# Patient Record
Sex: Male | Born: 1970 | Race: White | Hispanic: No | Marital: Married | Smoking: Former smoker
Health system: Southern US, Community
[De-identification: ages and names within clinical notes are randomized; demographics above are authoritative.]

## PROBLEM LIST (undated history)

## (undated) DIAGNOSIS — R079 Chest pain, unspecified: Secondary | ICD-10-CM

## (undated) DIAGNOSIS — I10 Essential (primary) hypertension: Secondary | ICD-10-CM

## (undated) DIAGNOSIS — R002 Palpitations: Secondary | ICD-10-CM

## (undated) HISTORY — DX: Essential (primary) hypertension: I10

## (undated) HISTORY — PX: ADENOIDECTOMY: SUR15

## (undated) HISTORY — DX: Chest pain, unspecified: R07.9

## (undated) HISTORY — DX: Palpitations: R00.2

---

## 2003-01-19 ENCOUNTER — Encounter: Admission: RE | Admit: 2003-01-19 | Discharge: 2003-01-19 | Payer: Self-pay | Admitting: Family Medicine

## 2003-01-19 ENCOUNTER — Encounter: Payer: Self-pay | Admitting: Family Medicine

## 2004-10-01 ENCOUNTER — Ambulatory Visit: Payer: Self-pay | Admitting: Internal Medicine

## 2004-12-16 ENCOUNTER — Ambulatory Visit: Payer: Self-pay | Admitting: Internal Medicine

## 2005-09-30 ENCOUNTER — Ambulatory Visit: Payer: Self-pay | Admitting: Internal Medicine

## 2005-10-01 ENCOUNTER — Encounter: Admission: RE | Admit: 2005-10-01 | Discharge: 2005-11-03 | Payer: Self-pay | Admitting: Internal Medicine

## 2006-03-31 ENCOUNTER — Ambulatory Visit: Payer: Self-pay | Admitting: Internal Medicine

## 2008-03-05 ENCOUNTER — Ambulatory Visit: Payer: Self-pay | Admitting: Internal Medicine

## 2008-03-08 ENCOUNTER — Encounter (INDEPENDENT_AMBULATORY_CARE_PROVIDER_SITE_OTHER): Payer: Self-pay | Admitting: *Deleted

## 2008-03-08 LAB — CONVERTED CEMR LAB
AST: 11 units/L (ref 0–37)
Basophils Absolute: 0 10*3/uL (ref 0.0–0.1)
Chloride: 106 meq/L (ref 96–112)
Eosinophils Absolute: 0.3 10*3/uL (ref 0.0–0.7)
Eosinophils Relative: 5 % (ref 0.0–5.0)
GFR calc non Af Amer: 100 mL/min
HDL: 30.1 mg/dL — ABNORMAL LOW (ref >39.0)
MCHC: 33.5 g/dL (ref 30.0–36.0)
MCV: 91.1 fL (ref 78.0–100.0)
Neutrophils Relative %: 56.4 % (ref 43.0–77.0)
Platelets: 271 10*3/uL (ref 150–400)
Potassium: 4.5 meq/L (ref 3.5–5.1)
TSH: 0.71 microintl units/mL (ref 0.35–5.50)
VLDL: 11 mg/dL (ref 0–40)
WBC: 6.2 10*3/uL (ref 4.5–10.5)

## 2009-02-05 DIAGNOSIS — R079 Chest pain, unspecified: Secondary | ICD-10-CM

## 2009-02-05 HISTORY — DX: Chest pain, unspecified: R07.9

## 2009-02-13 ENCOUNTER — Ambulatory Visit: Payer: Self-pay | Admitting: Internal Medicine

## 2009-02-13 DIAGNOSIS — R079 Chest pain, unspecified: Secondary | ICD-10-CM

## 2009-02-28 ENCOUNTER — Encounter: Payer: Self-pay | Admitting: Internal Medicine

## 2009-03-22 ENCOUNTER — Ambulatory Visit: Payer: Self-pay | Admitting: Internal Medicine

## 2009-03-22 DIAGNOSIS — I10 Essential (primary) hypertension: Secondary | ICD-10-CM

## 2009-03-25 ENCOUNTER — Telehealth (INDEPENDENT_AMBULATORY_CARE_PROVIDER_SITE_OTHER): Payer: Self-pay | Admitting: *Deleted

## 2009-04-03 LAB — CONVERTED CEMR LAB
Calcium: 9.2 mg/dL (ref 8.4–10.5)
GFR calc non Af Amer: 99.66 mL/min (ref >60)
HDL: 35.9 mg/dL — ABNORMAL LOW (ref >39.00)
Hemoglobin: 14.4 g/dL (ref 13.0–17.0)
Sodium: 141 meq/L (ref 135–145)
TSH: 0.58 microintl units/mL (ref 0.35–5.50)
Triglycerides: 52 mg/dL (ref 0.0–149.0)

## 2010-02-21 ENCOUNTER — Ambulatory Visit: Payer: Self-pay | Admitting: Internal Medicine

## 2010-04-18 ENCOUNTER — Ambulatory Visit: Payer: Self-pay | Admitting: Internal Medicine

## 2010-04-21 LAB — CONVERTED CEMR LAB
CO2: 31 meq/L (ref 19–32)
Calcium: 9.3 mg/dL (ref 8.4–10.5)
Chloride: 104 meq/L (ref 96–112)
Sodium: 142 meq/L (ref 135–145)

## 2010-10-07 NOTE — Assessment & Plan Note (Signed)
Summary: discuss bp med//kn   Vital Signs:  Patient profile:   40 year old male Height:      68.25 inches Weight:      178 pounds BMI:     26.96 Temp:     97.6 degrees F oral Pulse rate:   60 / minute BP sitting:   110 / 90  (left arm)  Vitals Entered By: Jeremy Johann CMA (February 21, 2010 10:47 AM) CC: discuss BP med Comments --fasting REVIEWED MED LIST, PATIENT AGREED DOSE AND INSTRUCTION CORRECT    History of Present Illness: BP f/u  on micardis up to 3 weeks ago when he ran out  BP was  as low as 100/70, no symptoms of hypotension Without micardis, his BP is still acceptable, usually 120/85, occasionally 120/90  Allergies (verified): No Known Drug Allergies  Past History:  Past Medical History: Reviewed history from 03/22/2009 and no changes required. Hypertension CP: 02-2009 normal  stress ECHO w/ incidental patent foramen ovale  Past Surgical History: Reviewed history from 03/05/2008 and no changes required. Denies surgical history  Social History: Reviewed history from 03/22/2009 and no changes required. Married 2 children original from [place] quit smoking August 2009 Alcohol use-yes (occ) Drug use-no Regular exercise-no caffeine use - 2 daily  Review of Systems       doing great, eating better, has lost 16 pounds Denies any cough No lower extremity edema  Physical Exam  General:  alert, well-developed, and well-nourished.   Lungs:  normal respiratory effort, no intercostal retractions, no accessory muscle use, and normal breath sounds.   Heart:  normal rate, regular rhythm, no murmur, and no gallop.   Abdomen:  soft and non-tender.   Extremities:  no edema   Impression & Recommendations:  Problem # 1:  HYPERTENSION (ICD-401.9) was doing well on a low dose of micardis , ambulatory BP off micardis is his still acceptable although he has a DBP  of 90 sometimes I think he still will benefit from a low dose of ARB cost is an issue, change to  lossartan  His updated medication list for this problem includes:    Losartan Potassium 25 Mg Tabs (Losartan potassium) ..... One by mouth daily  Problem # 2:  RASH-NONVESICULAR (ICD-782.1) using triamcinolone p.r.n. for "sun rash" RF His updated medication list for this problem includes:    Triamcinolone Acetonide 0.1 % Lotn (Triamcinolone acetonide) .Marland Kitchen... Apply two times a day as needed  Complete Medication List: 1)  Losartan Potassium 25 Mg Tabs (Losartan potassium) .... One by mouth daily 2)  Aspirin 81 Mg Tbec (Aspirin) .Marland Kitchen.. 1 a day 3)  Triamcinolone Acetonide 0.1 % Lotn (Triamcinolone acetonide) .... Apply two times a day as needed  Patient Instructions: 1)  Check your blood pressure 2 or 3 times a week. If it is more than 140/85 or less than 100/60 consistently,please let us know  2)  Nurse visit in 2 months for a BP check and BMP 3)   schedule a physical exam for March 2012 Prescriptions: TRIAMCINOLONE ACETONIDE 0.1 % LOTN (TRIAMCINOLONE ACETONIDE) apply two times a day as needed  #1 x 3   Entered and Authorized by:   Elita Quick E. Delonte Musich MD   Signed by:   Nolon Rod. Martyn Timme MD on 02/21/2010   Method used:   Electronically to        [facility] [address] (retail)       [phone]       [facility]  [place], [place]  [address]       Ph: [phone]       Fax: [phone]   RxID:   [id number] POTASSIUM 25 MG TABS (LOSARTAN POTASSIUM) one by mouth daily  #90 x 3   Entered and Authorized by:   Nolon Rod. Delynda Sepulveda MD   Signed by:   Nolon Rod. Dearra Myhand MD on 02/21/2010   Method used:   Electronically to        [facility] [address] [place] (retail)       [phone]       [place], [place]  [address]       Ph: [phone]       Fax: [phone]   RxID:   [id number]

## 2010-10-07 NOTE — Assessment & Plan Note (Signed)
Summary: BP CHECK/CBS LAB BMP ADD DX/CBS   Vital Signs:  Patient profile:   40 year old male Pulse rate:   66 / minute Pulse rhythm:   regular BP sitting:   124 / 80  (left arm) Cuff size:   large  Vitals Entered By: Army Fossa CMA (April 18, 2010 11:26 AM) CC: BP Check only- does not need to see Doc ( will be changed to a nurse visit)    Current Medications (verified): 1)  Losartan Potassium 25 Mg Tabs (Losartan Potassium) .... One By Mouth Daily 2)  Aspirin 81 Mg Tbec (Aspirin) .Marland Kitchen.. 1 A Day 3)  Triamcinolone Acetonide 0.1 % Lotn (Triamcinolone Acetonide) .... Apply Two Times A Day As Needed  Allergies (verified): No Known Drug Allergies   Impression & Recommendations:  Problem # 1:  HYPERTENSION (ICD-401.9) BP excellent His updated medication list for this problem includes:    Losartan Potassium 25 Mg Tabs (Losartan potassium) ..... One by mouth daily  Orders: Venipuncture ([address]) TLB-BMP (Basic Metabolic Panel-BMET) (80048-METABOL) Specimen Handling ([address])  BP today: 124/80 Prior BP: 110/90 (02/21/2010)  Labs Reviewed: K+: 4.7 (03/22/2009) Creat: : 0.9 (03/22/2009)   Chol: 138 (03/22/2009)   HDL: 35.90 (03/22/2009)   LDL: 92 (03/22/2009)   TG: 52.0 (03/22/2009)  Complete Medication List: 1)  Losartan Potassium 25 Mg Tabs (Losartan potassium) .... One by mouth daily 2)  Aspirin 81 Mg Tbec (Aspirin) .Marland Kitchen.. 1 a day 3)  Triamcinolone Acetonide 0.1 % Lotn (Triamcinolone acetonide) .... Apply two times a day as needed

## 2010-11-05 ENCOUNTER — Other Ambulatory Visit: Payer: Self-pay | Admitting: Internal Medicine

## 2010-11-05 ENCOUNTER — Encounter: Payer: Self-pay | Admitting: Internal Medicine

## 2010-11-05 ENCOUNTER — Encounter (INDEPENDENT_AMBULATORY_CARE_PROVIDER_SITE_OTHER): Payer: BC Managed Care – PPO | Admitting: Internal Medicine

## 2010-11-05 DIAGNOSIS — Z0000 Encounter for general adult medical examination without abnormal findings: Secondary | ICD-10-CM

## 2010-11-05 LAB — LIPID PANEL
Cholesterol: 134 mg/dL (ref 0–200)
HDL: 44 mg/dL (ref >39.00)
Total CHOL/HDL Ratio: 3
Triglycerides: 40 mg/dL (ref 0.0–149.0)

## 2010-11-05 LAB — BASIC METABOLIC PANEL
BUN: 22 mg/dL (ref 6–23)
CO2: 29 mEq/L (ref 19–32)
Calcium: 9.4 mg/dL (ref 8.4–10.5)
Creatinine, Ser: 1.1 mg/dL (ref 0.4–1.5)

## 2010-11-05 LAB — CBC WITH DIFFERENTIAL/PLATELET
Basophils Absolute: 0 10*3/uL (ref 0.0–0.1)
Basophils Relative: 0.6 % (ref 0.0–3.0)
Eosinophils Absolute: 0.2 10*3/uL (ref 0.0–0.7)
Lymphocytes Relative: 48.1 % — ABNORMAL HIGH (ref 12.0–46.0)
MCHC: 34.3 g/dL (ref 30.0–36.0)
Monocytes Absolute: 0.4 10*3/uL (ref 0.1–1.0)
Neutrophils Relative %: 40.6 % — ABNORMAL LOW (ref 43.0–77.0)
Platelets: 233 10*3/uL (ref 150.0–400.0)
RBC: 4.81 Mil/uL (ref 4.22–5.81)
RDW: 13.7 % (ref 11.5–14.6)

## 2010-11-13 NOTE — Assessment & Plan Note (Signed)
Summary: cpx and fasting labs///sph  NS FEE   Vital Signs:  Patient profile:   40 year old male Height:      68 inches Weight:      178.13 pounds Pulse rate:   63 / minute Pulse rhythm:   regular BP sitting:   125 / 83  (left arm) Cuff size:   large  Vitals Entered By: Army Fossa CMA (November 05, 2010 9:33 AM) CC: CPX, fasting  Comments no concerns [facility] [employer]    History of Present Illness: CPX ambulatory BPs wnl   Preventive Screening-Counseling & Management  Alcohol-Tobacco     Alcohol type: social     Smoking Status: quit  Current Medications (verified): 1)  Losartan Potassium 25 Mg Tabs (Losartan Potassium) .... One By Mouth Daily 2)  Aspirin 81 Mg Tbec (Aspirin) .Marland Kitchen.. 1 A Day 3)  Triamcinolone Acetonide 0.1 % Lotn (Triamcinolone Acetonide) .... Apply Two Times A Day As Needed  Allergies (verified): No Known Drug Allergies  Past History:  Past Medical History: Reviewed history from 03/22/2009 and no changes required. Hypertension CP: 02-2009 normal  stress ECHO w/ incidental patent foramen ovale  Past Surgical History: Reviewed history from 03/05/2008 and no changes required. Denies surgical history  Family History: prostate Ca - no colon Ca - no cancer brother, type?  Breast cancer, mother Gastric cancer, uncle CAD - MI F at age 66  DM - no HTN--M  Social History: Married 2 children original from [place] quit smoking August 2009 Alcohol use-yes (occ) Drug use-no Regular exercise- rarely  diet-- healthy most of the time  caffeine use - 2 daily Smoking Status:  quit  Review of Systems General:  Denies fatigue, fever, and weight loss. CV:  Denies chest pain or discomfort and swelling of feet; occasionally anxioux, no CP but chest dyscomfort , no assoc symptoms (neg stress test 2010) no exertionl symptoms . Resp:  Denies cough and shortness of breath. GI:  Denies bloody stools, diarrhea, nausea, and vomiting. GU:  Denies dysuria,  urinary frequency, and urinary hesitancy. Psych:  occasionally anxiety, no depression.  Physical Exam  General:  alert, well-developed, and well-nourished.   Neck:  no masses, no thyromegaly, and normal carotid upstroke.   Lungs:  normal respiratory effort, no intercostal retractions, no accessory muscle use, and normal breath sounds.   Heart:  normal rate, regular rhythm, no murmur, and no gallop.   Abdomen:  soft, non-tender, no distention, no masses, no guarding, and no rigidity.   Extremities:  no edema   Impression & Recommendations:  Problem # 1:  HEALTH SCREENING (ICD-V70.0) Td 03 diet exercise encourage sun screen rcommended labs RTC 1 year STE   Orders: Venipuncture ([address]) TLB-Lipid Panel (80061-LIPID) TLB-BMP (Basic Metabolic Panel-BMET) (80048-METABOL) TLB-CBC Platelet - w/Differential (85025-CBCD) Specimen Handling (98119)  Orders: Venipuncture (14[address]) TLB-Lipid Panel (80061-LIPID) TLB-BMP (Basic Metabolic Panel-BMET) (80048-METABOL) TLB-CBC Platelet - w/Differential (85025-CBCD) Specimen Handling ([address])  Problem # 2:  HYPERTENSION (ICD-401.9) at goal , no change  His updated medication list for this problem includes:    Losartan Potassium 25 Mg Tabs (Losartan potassium) ..... One by mouth daily  Problem # 3:  CHEST PAIN (ICD-786.50) see ROS, Rx observation   Complete Medication List: 1)  Losartan Potassium 25 Mg Tabs (Losartan potassium) .... One by mouth daily 2)  Aspirin 81 Mg Tbec (Aspirin) .Marland Kitchen.. 1 a day 3)  Triamcinolone Acetonide 0.1 % Lotn (Triamcinolone acetonide) .... Apply two times a day as needed  Patient Instructions: 1)  Please schedule a follow-up appointment in 1 year.  Prescriptions: LOSARTAN POTASSIUM 25 MG TABS (LOSARTAN POTASSIUM) one by mouth daily  #90 x 3   Entered by:   Army Fossa CMA   Authorized by:   Nolon Rod. Maximos Zayas MD   Signed by:   Army Fossa CMA on 11/05/2010   Method used:   Electronically to        [facility] [address] [place] (retail)       [phone]       [place], [place]  [address]       Ph: [phone]       Fax: [phone]   RxID:   [id number] TRIAMCINOLONE ACETONIDE 0.1 % LOTN (TRIAMCINOLONE ACETONIDE) apply two times a day as needed  #1 x 3   Entered by:   Army Fossa CMA   Authorized by:   Nolon Rod. Suzzette Gasparro MD   Signed by:   Army Fossa CMA on 11/05/2010   Method used:   Electronically to        [facility] [address] [place] (retail)       [phone]       [place], [place]  [address]       Ph: [phone]       Fax: [phone]   RxID:   [id number]    Orders Added: 1)  Venipuncture [[address]323] 2)  TLB-Lipid Panel [80061-LIPID] 3)  TLB-BMP (Basic Metabolic Panel-BMET) [80048-METABOL] 4)  TLB-CBC Platelet - w/Differential [85025-CBCD] 5)  Specimen Handling [99000] 6)  Est. Patient age 12-64 [92]     Risk Factors:  Tobacco use:  quit    Type:  social

## 2010-11-13 NOTE — Letter (Signed)
Summary: [facility] Personalized Risk Profile  [employer] Personalized Risk Profile   Imported By: Maryln Gottron 11/07/2010 13:24:18  _____________________________________________________________________  External Attachment:    Type:   Image     Comment:   External Document

## 2011-11-10 ENCOUNTER — Ambulatory Visit (INDEPENDENT_AMBULATORY_CARE_PROVIDER_SITE_OTHER): Payer: BC Managed Care – PPO | Admitting: Internal Medicine

## 2011-11-10 DIAGNOSIS — Z0000 Encounter for general adult medical examination without abnormal findings: Secondary | ICD-10-CM

## 2011-11-10 DIAGNOSIS — Z23 Encounter for immunization: Secondary | ICD-10-CM

## 2011-11-10 DIAGNOSIS — I10 Essential (primary) hypertension: Secondary | ICD-10-CM

## 2011-11-10 LAB — COMPREHENSIVE METABOLIC PANEL
ALT: 16 U/L (ref 0–53)
AST: 11 U/L (ref 0–37)
Alkaline Phosphatase: 48 U/L (ref 39–117)
Creatinine, Ser: 0.8 mg/dL (ref 0.4–1.5)
Sodium: 134 mEq/L — ABNORMAL LOW (ref 135–145)
Total Bilirubin: 1.1 mg/dL (ref 0.3–1.2)

## 2011-11-10 LAB — CBC WITH DIFFERENTIAL/PLATELET
Basophils Absolute: 0 10*3/uL (ref 0.0–0.1)
Eosinophils Absolute: 0.2 10*3/uL (ref 0.0–0.7)
HCT: 42.2 % (ref 39.0–52.0)
Hemoglobin: 14.2 g/dL (ref 13.0–17.0)
Lymphs Abs: 2.1 10*3/uL (ref 0.7–4.0)
MCHC: 33.6 g/dL (ref 30.0–36.0)
MCV: 88.6 fl (ref 78.0–100.0)
Monocytes Absolute: 0.3 10*3/uL (ref 0.1–1.0)
Monocytes Relative: 6.6 % (ref 3.0–12.0)
Neutro Abs: 2.5 10*3/uL (ref 1.4–7.7)
RDW: 13.6 % (ref 11.5–14.6)

## 2011-11-10 LAB — TSH: TSH: 1.04 u[IU]/mL (ref 0.35–5.50)

## 2011-11-10 LAB — LIPID PANEL: VLDL: 14.6 mg/dL (ref 0.0–40.0)

## 2011-11-10 MED ORDER — LOSARTAN POTASSIUM 50 MG PO TABS: 50.0000 mg | ORAL_TABLET | Freq: Every day | ORAL | Status: DC

## 2011-11-10 NOTE — Progress Notes (Signed)
  Subjective:    Patient ID: Elijah Kelly, male    DOB: Apr 03, 1971, 41 y.o.   MRN: [id number]  HPI CPX  Past Medical History: Hypertension CP: 02-2009 normal  stress ECHO w/ incidental patent foramen ovale  Past Surgical History: Denies surgical history  Family History: prostate Ca - no colon Ca - no cancer brother, type?  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  Respiratory: Negative for cough and shortness of breath.   Cardiovascular: Negative for chest pain and leg swelling.  Gastrointestinal: Negative for abdominal pain and blood in stool.  Genitourinary: Negative for dysuria, hematuria and difficulty urinating.  Psychiatric/Behavioral:       No depression or anxiety        Objective:   Physical Exam  General:  alert, well-developed, and well-nourished.   Neck:  no masses, no thyromegaly, and normal carotid upstroke.   Lungs:  normal respiratory effort, no intercostal retractions, no accessory muscle use, and normal breath sounds.   Heart:  normal rate, regular rhythm, no murmur, and no gallop.   Abdomen:  soft, non-tender, no distention, no masses, no guarding, and no rigidity.   Extremities:  no edema Psych: no depress-anxious appearing     Assessment & Plan:

## 2011-11-10 NOTE — Assessment & Plan Note (Signed)
Td 03 and today diet exercise encourage sun screen rcommended labs RTC 1 year STE

## 2011-11-10 NOTE — Patient Instructions (Signed)
Check the  blood pressure 2 or 3 times a month, be sure it is less than 140/85. If it is consistently higher, let me know  

## 2011-11-10 NOTE — Assessment & Plan Note (Signed)
good medication compliance No change, see instructions

## 2011-11-11 ENCOUNTER — Encounter: Payer: Self-pay | Admitting: Internal Medicine

## 2011-11-12 ENCOUNTER — Encounter: Payer: Self-pay | Admitting: *Deleted

## 2011-11-16 ENCOUNTER — Encounter: Payer: Self-pay | Admitting: *Deleted

## 2011-12-11 ENCOUNTER — Telehealth: Payer: Self-pay | Admitting: Internal Medicine

## 2011-12-11 NOTE — Telephone Encounter (Signed)
Patient came into the office today with a letter from his ins co. Stating he needs to use [facility] for his losartan. Patient also brought in an old pill bottle for losartan 25mg  take 1 tablet by mouth daily. Patient states the last rx he picked up at [facility] is for 50mg . Patient states he is not aware of a dosage change and would like a clarification on what his dosage should be. After dosage clarification, patient would like a refill sent to [facility]. Call pt on mobile # [phone]

## 2011-12-11 NOTE — Telephone Encounter (Signed)
Also, patient would like cream called in for sun rash to [facility] [address].

## 2011-12-11 NOTE — Telephone Encounter (Signed)
I looked at the most recent OV & it does seem that you prescribed losartan 50mg  1 po daily. Please advise.

## 2011-12-14 ENCOUNTER — Telehealth: Payer: Self-pay | Admitting: *Deleted

## 2011-12-14 MED ORDER — LOSARTAN POTASSIUM 50 MG PO TABS: 50.0000 mg | ORAL_TABLET | Freq: Every day | ORAL | Status: DC

## 2011-12-14 NOTE — Telephone Encounter (Signed)
Losartan 50 mg one by mouth daily, okay to call 90 and 3 refills. As far as sun rash, Aveeno lotion over the counter as needed

## 2011-12-14 NOTE — Telephone Encounter (Signed)
Pt called back wanting to know why his losartan was changed from 25mg  to 50mg . Please advise.   See previous phone note.

## 2011-12-14 NOTE — Telephone Encounter (Signed)
Discussed with pt & sent in rx.  

## 2011-12-15 MED ORDER — LOSARTAN POTASSIUM 25 MG PO TABS: 25.0000 mg | ORAL_TABLET | Freq: Every day | ORAL | Status: DC

## 2011-12-15 NOTE — Telephone Encounter (Signed)
Because according to our notes, he was on 50 mg of losartan, if he was actually taking losartan 25 mg and his BP was normal, then he can stay on 25 mg.

## 2011-12-15 NOTE — Telephone Encounter (Signed)
Rx sent 

## 2011-12-15 NOTE — Telephone Encounter (Signed)
LMOVM for pt to return call 

## 2011-12-22 ENCOUNTER — Other Ambulatory Visit: Payer: Self-pay | Admitting: *Deleted

## 2011-12-22 MED ORDER — LOSARTAN POTASSIUM 25 MG PO TABS: 25.0000 mg | ORAL_TABLET | Freq: Every day | ORAL | Status: DC

## 2012-11-17 ENCOUNTER — Encounter: Payer: Self-pay | Admitting: Internal Medicine

## 2012-11-17 ENCOUNTER — Ambulatory Visit (INDEPENDENT_AMBULATORY_CARE_PROVIDER_SITE_OTHER): Payer: BC Managed Care – PPO | Admitting: Internal Medicine

## 2012-11-17 VITALS — BP 118/76 | HR 63 | Temp 97.6°F | Ht 69.0 in | Wt 190.0 lb

## 2012-11-17 DIAGNOSIS — I10 Essential (primary) hypertension: Secondary | ICD-10-CM

## 2012-11-17 DIAGNOSIS — Z0000 Encounter for general adult medical examination without abnormal findings: Secondary | ICD-10-CM

## 2012-11-17 LAB — CBC WITH DIFFERENTIAL/PLATELET
Basophils Relative: 0.7 % (ref 0.0–3.0)
Eosinophils Absolute: 0.2 10*3/uL (ref 0.0–0.7)
Eosinophils Relative: 4.1 % (ref 0.0–5.0)
Hemoglobin: 13.8 g/dL (ref 13.0–17.0)
Lymphocytes Relative: 45.5 % (ref 12.0–46.0)
Monocytes Relative: 8.4 % (ref 3.0–12.0)
Neutrophils Relative %: 41.3 % — ABNORMAL LOW (ref 43.0–77.0)
RBC: 4.72 Mil/uL (ref 4.22–5.81)
WBC: 4.4 10*3/uL — ABNORMAL LOW (ref 4.5–10.5)

## 2012-11-17 LAB — LIPID PANEL
HDL: 38.5 mg/dL — ABNORMAL LOW (ref >39.00)
LDL Cholesterol: 83 mg/dL (ref 0–99)
Total CHOL/HDL Ratio: 4
Triglycerides: 99 mg/dL (ref 0.0–149.0)
VLDL: 19.8 mg/dL (ref 0.0–40.0)

## 2012-11-17 LAB — BASIC METABOLIC PANEL
Calcium: 9.2 mg/dL (ref 8.4–10.5)
Creatinine, Ser: 0.9 mg/dL (ref 0.4–1.5)
Sodium: 139 mEq/L (ref 135–145)

## 2012-11-17 MED ORDER — LOSARTAN POTASSIUM 25 MG PO TABS: 25.0000 mg | ORAL_TABLET | Freq: Every day | ORAL | Status: DC

## 2012-11-17 NOTE — Progress Notes (Signed)
  Subjective:    Patient ID: Elijah Kelly, male    DOB: Jan 07, 1971, 42 y.o.   MRN: [id number]  HPI CPX  Past Medical History  Diagnosis Date  . HTN (hypertension)   . Chest pain 02-2009     normal  stress ECHO w/ incidental patent foramen ovale   Past Surgical History  Procedure Laterality Date  . Adenoidectomy     History   Social History  . Marital Status: Married    Spouse Name: N/A    Number of Children: 2  . Years of Education: N/A   Occupational History  . pharmaceuticals    Social History Main Topics  . Smoking status: Former Games developer  . Smokeless tobacco: Never Used     Comment: quit 2009  . Alcohol Use: Yes     Comment: occasional  . Drug Use: No  . Sexually Active: Not on file   Other Topics Concern  . Not on file   Social History Narrative   Married, 2 children, original from [place]   Regular exercise- ~ 1 / week   Diet: trying to eat healthyregular         Family History  Problem Relation Age of Onset  . Cancer - Colon Neg Hx   . Cancer - Prostate Neg Hx   . CAD Neg Hx   . Hypertension Mother   . Stroke Neg Hx   . Diabetes      F late onset    Review of Systems  in general feels well. Good compliance with BP meds, ambulatory BPs normal. Very rarely feels is slightly anxious, occasional that feeling is associated with mild palpitations. denies chest pain, shortness of breath or cough. No nausea, vomiting, diarrhea. No blood in the stools. No dysuria gross hematuria.      Objective:   Physical Exam General -- alert, well-developed    Neck --no thyromegaly , normal carotid pulse  Lungs -- normal respiratory effort, no intercostal retractions, no accessory muscle use, and normal breath sounds.   Heart-- normal rate, regular rhythm, no murmur, and no gallop.   Abdomen--soft, non-tender, no distention, no masses, no HSM, no guarding, and no rigidity.   Extremities-- no pretibial edema bilaterally Neurologic-- alert & oriented X3 and strength  normal in all extremities. Psych-- Cognition and judgment appear intact. Alert and cooperative with normal attention span and concentration.  not anxious appearing and not depressed appearing.      Assessment & Plan:

## 2012-11-17 NOTE — Assessment & Plan Note (Addendum)
Td 03 and 2013 A healthy diet exercise encouraged STE Labs, EKG sinus brady, unchanged from previous RTC 1 year

## 2012-11-17 NOTE — Patient Instructions (Addendum)
Check the  blood pressure 2 or 3 times a week, be sure it is between 110/60 and 140/85. If it is consistently higher or lower, let me know  

## 2012-11-17 NOTE — Assessment & Plan Note (Signed)
Well-controlled, no change 

## 2013-07-13 ENCOUNTER — Other Ambulatory Visit: Payer: Self-pay

## 2013-09-26 ENCOUNTER — Other Ambulatory Visit: Payer: Self-pay | Admitting: *Deleted

## 2013-09-26 MED ORDER — LOSARTAN POTASSIUM 25 MG PO TABS: 25.0000 mg | ORAL_TABLET | Freq: Every day | ORAL | Status: DC

## 2013-11-20 ENCOUNTER — Telehealth: Payer: Self-pay

## 2013-11-20 NOTE — Telephone Encounter (Signed)
Medication List and allergies:  Reviewed and updated  90 day supply/mail order: [facility] Local prescriptions: [facility] [address] [place]  Immunizations due: UTD  A/P:   No changes to FH, PSH or Personal Hx Flu vaccine--07/2013 Tdap--11/2011  To Discuss with Provider: [facility]

## 2013-11-21 ENCOUNTER — Ambulatory Visit (INDEPENDENT_AMBULATORY_CARE_PROVIDER_SITE_OTHER): Payer: 59 | Admitting: Internal Medicine

## 2013-11-21 ENCOUNTER — Encounter: Payer: Self-pay | Admitting: Internal Medicine

## 2013-11-21 VITALS — BP 108/74 | HR 60 | Temp 97.9°F | Ht 69.0 in | Wt 192.0 lb

## 2013-11-21 DIAGNOSIS — Q2112 Patent foramen ovale: Secondary | ICD-10-CM

## 2013-11-21 DIAGNOSIS — Z0000 Encounter for general adult medical examination without abnormal findings: Secondary | ICD-10-CM

## 2013-11-21 DIAGNOSIS — I10 Essential (primary) hypertension: Secondary | ICD-10-CM

## 2013-11-21 DIAGNOSIS — Q211 Atrial septal defect: Secondary | ICD-10-CM | POA: Insufficient documentation

## 2013-11-21 LAB — CBC WITH DIFFERENTIAL/PLATELET
BASOS PCT: 0.6 % (ref 0.0–3.0)
Basophils Absolute: 0 10*3/uL (ref 0.0–0.1)
EOS ABS: 0.2 10*3/uL (ref 0.0–0.7)
Eosinophils Relative: 3.4 % (ref 0.0–5.0)
HCT: 42.5 % (ref 39.0–52.0)
Hemoglobin: 14.2 g/dL (ref 13.0–17.0)
LYMPHS PCT: 39.3 % (ref 12.0–46.0)
Lymphs Abs: 2.2 10*3/uL (ref 0.7–4.0)
MCHC: 33.5 g/dL (ref 30.0–36.0)
MCV: 88.3 fl (ref 78.0–100.0)
MONO ABS: 0.4 10*3/uL (ref 0.1–1.0)
Monocytes Relative: 6.9 % (ref 3.0–12.0)
NEUTROS PCT: 49.8 % (ref 43.0–77.0)
Neutro Abs: 2.8 10*3/uL (ref 1.4–7.7)
PLATELETS: 222 10*3/uL (ref 150.0–400.0)
RBC: 4.82 Mil/uL (ref 4.22–5.81)
RDW: 13.5 % (ref 11.5–14.6)
WBC: 5.5 10*3/uL (ref 4.5–10.5)

## 2013-11-21 LAB — LIPID PANEL
CHOLESTEROL: 144 mg/dL (ref 0–200)
HDL: 41.5 mg/dL (ref >39.00)
LDL CALC: 87 mg/dL (ref 0–99)
TRIGLYCERIDES: 80 mg/dL (ref 0.0–149.0)
Total CHOL/HDL Ratio: 3
VLDL: 16 mg/dL (ref 0.0–40.0)

## 2013-11-21 LAB — COMPREHENSIVE METABOLIC PANEL
ALBUMIN: 4.5 g/dL (ref 3.5–5.2)
ALT: 18 U/L (ref 0–53)
AST: 11 U/L (ref 0–37)
Alkaline Phosphatase: 57 U/L (ref 39–117)
BUN: 17 mg/dL (ref 6–23)
CALCIUM: 9.2 mg/dL (ref 8.4–10.5)
CHLORIDE: 103 meq/L (ref 96–112)
CO2: 28 mEq/L (ref 19–32)
Creatinine, Ser: 1 mg/dL (ref 0.4–1.5)
GFR: 90.43 mL/min (ref >60.00)
GLUCOSE: 98 mg/dL (ref 70–99)
POTASSIUM: 4.6 meq/L (ref 3.5–5.1)
SODIUM: 137 meq/L (ref 135–145)
TOTAL PROTEIN: 7.8 g/dL (ref 6.0–8.3)
Total Bilirubin: 1.7 mg/dL — ABNORMAL HIGH (ref 0.3–1.2)

## 2013-11-21 LAB — TSH: TSH: 0.52 u[IU]/mL (ref 0.35–5.50)

## 2013-11-21 NOTE — Assessment & Plan Note (Addendum)
Found to have a small PFO in 2010, cardiology at that time recommended followup in 3 years. Will arrange

## 2013-11-21 NOTE — Patient Instructions (Signed)
Get your blood work before you leave   Next visit is for a physical exam in 1 year,  fasting Please make an appointment    Check the  blood pressure  monthly be sure it is between 110/60 and 140/85. Ideal blood pressure is 120/80. If it is consistently higher or lower, let me know

## 2013-11-21 NOTE — Progress Notes (Signed)
   Subjective:    Patient ID: Elijah Kelly, male    DOB: 09/02/1971, 43 y.o.   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 BPs are checked then and are always ~ 120/80, no lower extremity edema No orthopnea , DOE Denies  nausea, vomiting diarrhea Denies  blood in the stools (-) cough, sputum production (-) wheezing, chest congestion no dysuria, gross hematuria, difficulty urinating  Some stress but no anxiety, depression   Past Medical History  Diagnosis Date  . HTN (hypertension)   . Chest pain 02-2009     normal  stress ECHO w/ incidental patent foramen ovale    Past Surgical History  Procedure Laterality Date  . Adenoidectomy      History   Social History  . Marital Status: Married    Spouse Name: N/A    Number of Children: 2  . Years of Education: N/A   Occupational History  . pharmaceuticals    Social History Main Topics  . Smoking status: Former Games developermoker  . Smokeless tobacco: Never Used     Comment: quit 2009  . Alcohol Use: Yes     Comment: occasional  . Drug Use: No  . Sexual Activity: Not on file   Other Topics Concern  . Not on file   Social History Narrative   Married, 2 children, original from [place]              Family History  Problem Relation Age of Onset  . Cancer - Colon Neg Hx   . Cancer - Prostate Neg Hx   . CAD Neg Hx   . Hypertension Mother   . Stroke Neg Hx   . Diabetes Father     F late onset        Medication List       This list is accurate as of: 11/21/13 11:59 PM.  Always use your most recent med list.               aspirin 81 MG tablet  Take 81 mg by mouth daily.     losartan 25 MG tablet  Commonly known as:  COZAAR  Take 1 tablet (25 mg total) by mouth daily.             Objective:   Physical Exam BP 108/74  Pulse 60  Temp(Src) 97.9 F (36.6 C)  Ht 5\' 9"  (1.753 m)  Wt 192 lb (87.091 kg)  BMI 28.34 kg/m2  SpO2 98% General -- alert, well-developed, NAD.  Neck --no thyromegaly  HEENT-- Not pale.   Lungs -- normal respiratory effort, no intercostal retractions, no accessory muscle use, and normal breath sounds.  Heart-- normal rate, regular rhythm, no murmur.  Abdomen-- Not distended, good bowel sounds,soft, non-tender. No rebound or rigidity. No mass,organomegaly. Extremities-- no pretibial edema bilaterally  Neurologic--  alert & oriented X3. Speech normal, gait normal, strength normal in all extremities.  Psych-- Cognition and judgment appear intact. Cooperative with normal attention span and concentration. No anxious or depressed appearing.      Assessment & Plan:

## 2013-11-21 NOTE — Assessment & Plan Note (Signed)
Td 03 and 2013 A healthy diet exercise encouraged  STE Labs  RTC 1 year

## 2013-11-21 NOTE — Assessment & Plan Note (Signed)
Well-controlled, no change 

## 2013-11-21 NOTE — Progress Notes (Signed)
Pre visit review using our clinic review tool, if applicable. No additional management support is needed unless otherwise documented below in the visit note. 

## 2013-11-22 ENCOUNTER — Telehealth: Payer: Self-pay | Admitting: Internal Medicine

## 2013-11-22 NOTE — Telephone Encounter (Signed)
Relevant patient education assigned to patient using Emmi. ° °

## 2013-11-24 ENCOUNTER — Encounter: Payer: Self-pay | Admitting: Internal Medicine

## 2013-12-11 ENCOUNTER — Other Ambulatory Visit: Payer: Self-pay | Admitting: Internal Medicine

## 2013-12-13 ENCOUNTER — Ambulatory Visit: Payer: 59 | Admitting: Cardiovascular Disease

## 2013-12-15 ENCOUNTER — Ambulatory Visit: Payer: 59 | Admitting: Cardiovascular Disease

## 2014-01-01 ENCOUNTER — Encounter: Payer: Self-pay | Admitting: Cardiovascular Disease

## 2014-01-01 ENCOUNTER — Ambulatory Visit (INDEPENDENT_AMBULATORY_CARE_PROVIDER_SITE_OTHER): Payer: 59 | Admitting: Cardiovascular Disease

## 2014-01-01 VITALS — BP 136/92 | HR 58 | Resp 16 | Ht 70.0 in | Wt 186.1 lb

## 2014-01-01 DIAGNOSIS — R002 Palpitations: Secondary | ICD-10-CM | POA: Insufficient documentation

## 2014-01-01 DIAGNOSIS — R079 Chest pain, unspecified: Secondary | ICD-10-CM

## 2014-01-01 NOTE — Patient Instructions (Signed)
Your physician has recommended that you wear an event monitor. Event monitors are medical devices that record the heart's electrical activity. Doctors most often us these monitors to diagnose arrhythmias. Arrhythmias are problems with the speed or rhythm of the heartbeat. The monitor is a small, portable device. You can wear one while you do your normal daily activities. This is usually used to diagnose what is causing palpitations/syncope (passing out).  We will call you with the results.

## 2014-01-01 NOTE — Progress Notes (Signed)
Patient ID: Elijah Kelly, male   DOB: 10/28/1970, 43 y.o.   MRN: [id number]      Reason for office visit Palpitations  [name] is an otherwise healthy 43 year old man who has been troubled by palpitations. His symptoms date back approximately 5 years when a close friend died unexpectedly of an acute myocardial infarction without any prodromal symptoms. He had palpitations and chest pain after hearing that news. He was then evaluated by Dr. Reyes Kelly. He had a normal stress echocardiogram with incidental diagnosis of a small PFO. LVEF was 55-60% and there were no significant abnormalities of the cardiac chambers or valves. She had mild hypertension and was started on an angiotensin receptor blocker (Micardis 40 mg daily) this was subsequently switched to losartan 50 mg daily for cost reasons and subsequently reduced to 25 mg daily since his blood pressure was good. He has gained roughly 10 pounds in weight since that time. He is physically active. 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 He does not have any palpitations during physical activity and denies exertional dyspnea or chest pain. He has not experienced syncope or near syncope. His palpitations are described as having a gradual onset and gradual termination and they're almost universally associated with emotional turmoil for example related to the health of his elderly parents or to issues surrounding his children. Other than hypertension he does not have any serious medical problems. He quit smoking in 2009. He has lived in the [place] since the [place] and has lived in [place] for [place] years. His children are 44 year old boy and a 542 year old daughter.    No Known Allergies  Current Outpatient Prescriptions  Medication Sig Dispense Refill  . aspirin 81 MG tablet Take 81 mg by  mouth daily.      Marland Kitchen. losartan (COZAAR) 25 MG tablet Take 1 tablet by mouth  daily  90 tablet  2   No current facility-administered medications for this visit.    Past Medical History  Diagnosis Date  . HTN (hypertension)   . Chest pain 02-2009     normal  stress ECHO w/ incidental patent foramen ovale    Past Surgical History  Procedure Laterality Date  . Adenoidectomy      Family History  Problem Relation Age of Onset  . Cancer - Colon Neg Hx   . Cancer - Prostate Neg Hx   . CAD Neg Hx   . Hypertension Mother   . Stroke Neg Hx   . Diabetes Father     F late onset     History   Social History  . Marital Status: Married    Spouse Name: N/A    Number of Children: 2  . Years of Education: N/A   Occupational History  . pharmaceuticals    Social History Main Topics  . Smoking status: Former Games developermoker  . Smokeless tobacco: Never Used     Comment: quit 2009  . Alcohol Use: Yes     Comment: occasional  . Drug Use: No  . Sexual Activity: Not on file   Other Topics Concern  . Not on file   Social History Narrative   Married, 2 children, original from [place]             Review of systems: The patient specifically denies any chest  pain at rest or with exertion, dyspnea at rest or with exertion, orthopnea, paroxysmal nocturnal dyspnea, syncope, focal neurological deficits, intermittent claudication, lower extremity edema, unexplained weight gain, cough, hemoptysis or wheezing.  The patient also denies abdominal pain, nausea, vomiting, dysphagia, diarrhea, constipation, polyuria, polydipsia, dysuria, hematuria, frequency, urgency, abnormal bleeding or bruising, fever, chills, unexpected weight changes, mood swings, change in skin or hair texture, change in voice quality, auditory or visual problems, allergic reactions or rashes, new musculoskeletal complaints other than usual "aches and pains".   PHYSICAL EXAM BP 136/92  Pulse 58  Resp 16  Ht 5\' 10"  (1.778 m)  Wt  186 lb 1.6 oz (84.414 kg)  BMI 26.70 kg/m2  General: Alert, oriented x3, no distress Head: no evidence of trauma, PERRL, EOMI, no exophtalmos or lid lag, no myxedema, no xanthelasma; normal ears, nose and oropharynx Neck: normal jugular venous pulsations and no hepatojugular reflux; brisk carotid pulses without delay and no carotid bruits Chest: clear to auscultation, no signs of consolidation by percussion or palpation, normal fremitus, symmetrical and full respiratory excursions Cardiovascular: normal position and quality of the apical impulse, regular rhythm, normal first and second heart sounds, no murmurs, rubs or gallops Abdomen: no tenderness or distention, no masses by palpation, no abnormal pulsatility or arterial bruits, normal bowel sounds, no hepatosplenomegaly Extremities: no clubbing, cyanosis or edema; 2+ radial, ulnar and brachial pulses bilaterally; 2+ right femoral, posterior tibial and dorsalis pedis pulses; 2+ left femoral, posterior tibial and dorsalis pedis pulses; no subclavian or femoral bruits Neurological: grossly nonfocal   EKG: NSR  Lipid Panel     Component Value Date/Time   CHOL 144 11/21/2013 1106   TRIG 80.0 11/21/2013 1106   HDL 41.50 11/21/2013 1106   CHOLHDL 3 11/21/2013 1106   VLDL 16.0 11/21/2013 1106   LDLCALC 87 11/21/2013 1106    BMET    Component Value Date/Time   NA 137 11/21/2013 1106   K 4.6 11/21/2013 1106   CL 103 11/21/2013 1106   CO2 28 11/21/2013 1106   GLUCOSE 98 11/21/2013 1106   BUN 17 11/21/2013 1106   CREATININE 1.0 11/21/2013 1106   CALCIUM 9.2 11/21/2013 1106   GFRNONAA 132.47 04/18/2010 1126   GFRAA 121 03/05/2008 0950     ASSESSMENT AND PLAN  [name] symptoms are mostly suggestive of sinus tachycardia related to emotional times. I have recommended that he wear an event monitor to to confirm this. He is physically active in his symptoms are never exertional. He had a normal stress echo a few years ago. His physical exam and  electrocardiogram are normal. His blood pressure is borderline high. I have recommended that he try to lose roughly 8-10 pounds of weight. Consider switching his antihypertensive to a centrally acting calcium channel blocker or a beta blocker simply for symptom palliation.  Orders Placed This Encounter  Procedures  . EKG 12-Lead  . Cardiac event monitor    Kalley Nicholl  Thurmon FairMihai Tanis Hensarling, MD, [facility] [phone] office [phone] pager

## 2014-01-04 NOTE — Addendum Note (Signed)
Addended by: Ronnell GuadalajaraLASSITER, Phi Avans A on: 01/04/2014 10:15 AM   Modules accepted: Orders

## 2014-02-05 ENCOUNTER — Telehealth: Payer: Self-pay | Admitting: *Deleted

## 2014-02-05 NOTE — Telephone Encounter (Signed)
Spoke to patient. Result given . Verbalized understanding  

## 2014-02-05 NOTE — Telephone Encounter (Signed)
Message copied by Tobin Chad on Mon Feb 05, 2014 11:45 AM ------      Message from: Thurmon Fair      Created: Sat Feb 03, 2014 10:53 AM       Normal tracings. Nocturnal bradycardia, sinus tachycardia during activity ------

## 2014-08-12 ENCOUNTER — Other Ambulatory Visit: Payer: Self-pay | Admitting: Internal Medicine

## 2014-12-11 ENCOUNTER — Telehealth: Payer: Self-pay | Admitting: Internal Medicine

## 2014-12-11 NOTE — Telephone Encounter (Signed)
Pre visit letter sent  °

## 2014-12-23 ENCOUNTER — Other Ambulatory Visit: Payer: Self-pay | Admitting: Internal Medicine

## 2014-12-31 ENCOUNTER — Ambulatory Visit (INDEPENDENT_AMBULATORY_CARE_PROVIDER_SITE_OTHER): Payer: 59 | Admitting: Internal Medicine

## 2014-12-31 ENCOUNTER — Encounter: Payer: Self-pay | Admitting: Internal Medicine

## 2014-12-31 VITALS — BP 124/72 | HR 68 | Temp 98.2°F | Ht 70.0 in | Wt 192.1 lb

## 2014-12-31 DIAGNOSIS — Z0000 Encounter for general adult medical examination without abnormal findings: Secondary | ICD-10-CM

## 2014-12-31 DIAGNOSIS — R21 Rash and other nonspecific skin eruption: Secondary | ICD-10-CM

## 2014-12-31 DIAGNOSIS — I10 Essential (primary) hypertension: Secondary | ICD-10-CM

## 2014-12-31 MED ORDER — HYDROCORTISONE 2.5 % EX CREA: TOPICAL_CREAM | Freq: Two times a day (BID) | CUTANEOUS | Status: DC

## 2014-12-31 NOTE — Patient Instructions (Signed)
Get your blood work before you leave   Check the  blood pressure 2 or 3 times a month  Be sure your blood pressure is between 110/65 and  145/85.  if it is consistently higher or lower, let me know    Come back to the office in 1 year   for a physical exam  Please schedule an appointment at the front desk    Come back fasting     

## 2014-12-31 NOTE — Progress Notes (Signed)
Subjective:    Patient ID: Elijah Kelly, male    DOB: 11/03/1970, 44 y.o.   MRN: [id number]  DOS:  12/31/2014 Type of visit - description : cpx Interval history:  [name] is a healthy 44 year old male presenting today for his annual physical exam. Since last visit, the patient saw Dr. Royann Shivers in cardiology on 01/01/14 who placed an event monitor for a month which revealed normal tracing with nocturnal bradycardia and sinus tachycardia with exertion. Patient's hypertension is currently well-controlled on Losartan and patient states no side effects from medicine.   He does note a rash located on his neck which he gets most commonly after sweating and being outdoors. He states that the rash is not pruritic or painful but does emit clear fluid occasionally (no blisters per se). He has put hydrocortisone cream on it and it typically takes approximately a week to resolve.    Review of Systems  Constitutional: No fever, chills. No unexplained wt changes. No unusual sweats HEENT: No dental problems, ear discharge, facial swelling, voice changes. No eye discharge, redness or intolerance to light Respiratory: No wheezing or difficulty breathing. No cough , mucus production Cardiovascular: No CP, leg swelling or palpitations GI: no nausea, vomiting, diarrhea or abdominal pain.  No blood in the stools. No dysphagia   Endocrine: No polyphagia, polyuria or polydipsia GU: No dysuria, gross hematuria, difficulty urinating. No urinary urgency or frequency. Musculoskeletal: No joint swellings or unusual aches or pains Skin: Rash on neck. No change in the color of the skin, pallor. Allergic, immunologic: No environmental allergies or food allergies Neurological: No dizziness or syncope. No headaches. No diplopia, slurred speech, motor deficits, facial numbness Hematological: No enlarged lymph nodes, easy bruising or bleeding Psychiatry: No suicidal ideas, hallucinations, behavior problems or confusion. No  unusual/severe anxiety or depression.     Past Medical History  Diagnosis Date  . HTN (hypertension)   . Chest pain 02-2009     normal  stress ECHO w/ incidental patent foramen ovale  . Palpitation     saw cards 2015    Past Surgical History  Procedure Laterality Date  . Adenoidectomy      History   Social History  . Marital Status: Married    Spouse Name: N/A  . Number of Children: 2  . Years of Education: N/A   Occupational History  . pharmaceuticals    Social History Main Topics  . Smoking status: Former Games developer  . Smokeless tobacco: Never Used     Comment: quit 2009  . Alcohol Use: Yes     Comment: occasional  . Drug Use: No  . Sexual Activity: Not on file   Other Topics Concern  . Not on file   Social History Narrative   Married, 2 children, original from [place]              Family History  Problem Relation Age of Onset  . Cancer - Colon Neg Hx   . Cancer - Prostate Neg Hx   . CAD Neg Hx   . Hypertension Mother   . Stroke Neg Hx   . Diabetes Father     F late onset       Medication List       This list is accurate as of: 12/31/14  6:30 PM.  Always use your most recent med list.               aspirin 81 MG tablet  Take 81  mg by mouth daily.     hydrocortisone 2.5 % cream  Apply topically 2 (two) times daily.     losartan 25 MG tablet  Commonly known as:  COZAAR  Take 1 tablet (25 mg total) by mouth daily.           Objective:   Physical Exam  Skin:     Few , 3-4 mm, slt red papules, no blisters   BP 124/72 mmHg  Pulse 68  Temp(Src) 98.2 F (36.8 C) (Oral)  Ht 5\' 10"  (1.778 m)  Wt 192 lb 2 oz (87.147 kg)  BMI 27.57 kg/m2  SpO2 97%  General:   Well developed, well nourished . NAD.  Neck:  Full range of motion. Supple. No  thyromegaly , normal carotid pulse HEENT:  Normocephalic . Face symmetric, atraumatic Lungs:  CTA B Normal respiratory effort, no intercostal retractions, no accessory muscle use. Heart: RRR,   no murmur, distal pulses intact. Abdomen:  Not distended, soft, non-tender. No rebound or rigidity. No mass,organomegaly Muscle skeletal: no pretibial edema bilaterally  Skin: Not pale. Not jaundice. 5-6 1 cm raised erythematous vesicles on lateral aspects of neck.    Neurologic:  alert & oriented X3.  Speech normal, gait appropriate for age and unassisted Psych: Cognition and judgment appear intact.  Cooperative with normal attention span and concentration.  Behavior appropriate. No anxious or depressed appearing. Rectal:  External abnormalities: none. Normal sphincter tone. No rectal masses or tenderness.  Stool brown  Prostate: Unable to reach the prostate      Assessment & Plan:

## 2014-12-31 NOTE — Progress Notes (Signed)
Pre visit review using our clinic review tool, if applicable. No additional management support is needed unless otherwise documented below in the visit note. 

## 2014-12-31 NOTE — Assessment & Plan Note (Signed)
  Td 03 and 2013 Healthy diet and exercise encouraged Will check CBC, FLP, CMP, A1c (per job requirement) , and TSH.  Will check HIV antibody Patient requests PSA test Patient instructed to return to clinic in one year.

## 2014-12-31 NOTE — Assessment & Plan Note (Signed)
Well-controlled on Cozaar, continue current regimen of medications. Patient instructed to check BP at home and return if BP consistently outside normal range.

## 2014-12-31 NOTE — Assessment & Plan Note (Signed)
Patient notes rash at the neck  is not pruritic or painful and is more significant after time in the sun with sweat.  Plan: Offered dermatology referral, patient declined. Will prescribe 2.5% hydrocortisone cream to apply twice daily as needed.

## 2015-01-01 LAB — CBC WITH DIFFERENTIAL/PLATELET
Basophils Absolute: 0 10*3/uL (ref 0.0–0.1)
Basophils Relative: 0.3 % (ref 0.0–3.0)
EOS ABS: 0.1 10*3/uL (ref 0.0–0.7)
Eosinophils Relative: 1.9 % (ref 0.0–5.0)
HEMATOCRIT: 41.4 % (ref 39.0–52.0)
Hemoglobin: 14 g/dL (ref 13.0–17.0)
Lymphocytes Relative: 34.4 % (ref 12.0–46.0)
Lymphs Abs: 1.9 10*3/uL (ref 0.7–4.0)
MCHC: 33.8 g/dL (ref 30.0–36.0)
MCV: 87.5 fl (ref 78.0–100.0)
MONO ABS: 0.4 10*3/uL (ref 0.1–1.0)
Monocytes Relative: 7 % (ref 3.0–12.0)
Neutro Abs: 3.2 10*3/uL (ref 1.4–7.7)
Neutrophils Relative %: 56.4 % (ref 43.0–77.0)
PLATELETS: 233 10*3/uL (ref 150.0–400.0)
RBC: 4.74 Mil/uL (ref 4.22–5.81)
RDW: 13.4 % (ref 11.5–15.5)
WBC: 5.6 10*3/uL (ref 4.0–10.5)

## 2015-01-01 LAB — COMPREHENSIVE METABOLIC PANEL
ALBUMIN: 4.7 g/dL (ref 3.5–5.2)
ALK PHOS: 79 U/L (ref 39–117)
ALT: 15 U/L (ref 0–53)
AST: 11 U/L (ref 0–37)
BUN: 14 mg/dL (ref 6–23)
CALCIUM: 9.6 mg/dL (ref 8.4–10.5)
CO2: 29 meq/L (ref 19–32)
Chloride: 103 mEq/L (ref 96–112)
Creatinine, Ser: 0.93 mg/dL (ref 0.40–1.50)
GFR: 93.33 mL/min (ref >60.00)
GLUCOSE: 81 mg/dL (ref 70–99)
Potassium: 4.4 mEq/L (ref 3.5–5.1)
SODIUM: 139 meq/L (ref 135–145)
TOTAL PROTEIN: 7.4 g/dL (ref 6.0–8.3)
Total Bilirubin: 1.4 mg/dL — ABNORMAL HIGH (ref 0.2–1.2)

## 2015-01-01 LAB — LIPID PANEL
Cholesterol: 131 mg/dL (ref 0–200)
HDL: 43.6 mg/dL (ref >39.00)
LDL Cholesterol: 71 mg/dL (ref 0–99)
NonHDL: 87.4
TRIGLYCERIDES: 80 mg/dL (ref 0.0–149.0)
Total CHOL/HDL Ratio: 3
VLDL: 16 mg/dL (ref 0.0–40.0)

## 2015-01-01 LAB — HEMOGLOBIN A1C: HEMOGLOBIN A1C: 5.6 % (ref 4.6–6.5)

## 2015-01-01 LAB — PSA: PSA: 0.65 ng/mL (ref 0.10–4.00)

## 2015-01-01 LAB — TSH: TSH: 0.72 u[IU]/mL (ref 0.35–4.50)

## 2015-01-01 LAB — HIV ANTIBODY (ROUTINE TESTING W REFLEX): HIV 1&2 Ab, 4th Generation: NONREACTIVE

## 2015-06-21 ENCOUNTER — Other Ambulatory Visit: Payer: Self-pay | Admitting: Internal Medicine

## 2016-01-02 ENCOUNTER — Telehealth: Payer: Self-pay | Admitting: *Deleted

## 2016-01-02 NOTE — Telephone Encounter (Signed)
No answer, no voicemail.

## 2016-01-03 ENCOUNTER — Encounter: Payer: Self-pay | Admitting: Internal Medicine

## 2016-01-03 ENCOUNTER — Ambulatory Visit (INDEPENDENT_AMBULATORY_CARE_PROVIDER_SITE_OTHER): Payer: 59 | Admitting: Internal Medicine

## 2016-01-03 VITALS — BP 106/88 | HR 63 | Temp 97.9°F | Ht 70.0 in | Wt 185.6 lb

## 2016-01-03 DIAGNOSIS — Z0000 Encounter for general adult medical examination without abnormal findings: Secondary | ICD-10-CM

## 2016-01-03 DIAGNOSIS — Z09 Encounter for follow-up examination after completed treatment for conditions other than malignant neoplasm: Secondary | ICD-10-CM

## 2016-01-03 LAB — COMPREHENSIVE METABOLIC PANEL
ALT: 12 U/L (ref 0–53)
AST: 10 U/L (ref 0–37)
Albumin: 4.2 g/dL (ref 3.5–5.2)
Alkaline Phosphatase: 55 U/L (ref 39–117)
BILIRUBIN TOTAL: 1.1 mg/dL (ref 0.2–1.2)
BUN: 14 mg/dL (ref 6–23)
CO2: 30 mEq/L (ref 19–32)
Calcium: 9.2 mg/dL (ref 8.4–10.5)
Chloride: 106 mEq/L (ref 96–112)
Creatinine, Ser: 0.97 mg/dL (ref 0.40–1.50)
GFR: 88.51 mL/min (ref >60.00)
GLUCOSE: 100 mg/dL — AB (ref 70–99)
POTASSIUM: 4 meq/L (ref 3.5–5.1)
Sodium: 139 mEq/L (ref 135–145)
Total Protein: 7.1 g/dL (ref 6.0–8.3)

## 2016-01-03 LAB — CBC WITH DIFFERENTIAL/PLATELET
BASOS PCT: 0.5 % (ref 0.0–3.0)
Basophils Absolute: 0 10*3/uL (ref 0.0–0.1)
EOS PCT: 5.1 % — AB (ref 0.0–5.0)
Eosinophils Absolute: 0.3 10*3/uL (ref 0.0–0.7)
HEMATOCRIT: 41.1 % (ref 39.0–52.0)
Hemoglobin: 13.7 g/dL (ref 13.0–17.0)
LYMPHS ABS: 2.2 10*3/uL (ref 0.7–4.0)
Lymphocytes Relative: 43.3 % (ref 12.0–46.0)
MCHC: 33.5 g/dL (ref 30.0–36.0)
MCV: 87.2 fl (ref 78.0–100.0)
MONOS PCT: 8.4 % (ref 3.0–12.0)
Monocytes Absolute: 0.4 10*3/uL (ref 0.1–1.0)
NEUTROS ABS: 2.2 10*3/uL (ref 1.4–7.7)
Neutrophils Relative %: 42.7 % — ABNORMAL LOW (ref 43.0–77.0)
PLATELETS: 220 10*3/uL (ref 150.0–400.0)
RBC: 4.71 Mil/uL (ref 4.22–5.81)
RDW: 13.6 % (ref 11.5–15.5)
WBC: 5.1 10*3/uL (ref 4.0–10.5)

## 2016-01-03 LAB — LIPID PANEL
CHOL/HDL RATIO: 3
Cholesterol: 140 mg/dL (ref 0–200)
HDL: 45.9 mg/dL (ref >39.00)
LDL Cholesterol: 82 mg/dL (ref 0–99)
NONHDL: 93.89
Triglycerides: 60 mg/dL (ref 0.0–149.0)
VLDL: 12 mg/dL (ref 0.0–40.0)

## 2016-01-03 MED ORDER — LOSARTAN POTASSIUM 25 MG PO TABS: 25.0000 mg | ORAL_TABLET | Freq: Every day | ORAL | Status: DC

## 2016-01-03 MED ORDER — HYDROCORTISONE 2.5 % EX CREA: TOPICAL_CREAM | Freq: Two times a day (BID) | CUTANEOUS | Status: DC

## 2016-01-03 NOTE — Progress Notes (Signed)
Subjective:    Patient ID: Elijah Kelly, male    DOB: 11/22/1970, 45 y.o.   MRN: [id number]  DOS:  01/03/2016 Type of visit - description : CPX Interval history:  No major concerns, doing well, ambulatory BPs 118, 130/85. BP today slightly low, he is completely asymptomatic  Review of Systems  Constitutional: No fever. No chills. No unexplained wt changes. No unusual sweats  HEENT: No dental problems, no ear discharge, no facial swelling, no voice changes. No eye discharge, no eye  redness , no  intolerance to light   Respiratory: No wheezing , no  difficulty breathing. No cough , no mucus production  Cardiovascular: No CP, no leg swelling , no  Palpitations  GI: no nausea, no vomiting, no diarrhea , no  abdominal pain.  No blood in the stools. No dysphagia, no odynophagia    Endocrine: No polyphagia, no polyuria , no polydipsia  GU: No dysuria, gross hematuria, difficulty urinating. No urinary urgency, no frequency.  Musculoskeletal: Had a fall a few weeks ago, was hurting @ the right shoulder, now is better  Skin: No change in the color of the skin, palor , no  Rash  Allergic, immunologic: No environmental allergies , no  food allergies  Neurological: No dizziness no  syncope. No headaches. No diplopia, no slurred, no slurred speech, no motor deficits, no facial  Numbness  Hematological: No enlarged lymph nodes, no easy bruising , no unusual bleedings  Psychiatry: No suicidal ideas, no hallucinations, no beavior problems, no confusion.  No unusual/severe anxiety, no depression    Past Medical History  Diagnosis Date  . HTN (hypertension)   . Chest pain 02-2009     normal  stress ECHO w/ incidental patent foramen ovale  . Palpitation     saw cards 2015    Past Surgical History  Procedure Laterality Date  . Adenoidectomy      Social History   Social History  . Marital Status: Married    Spouse Name: N/A  . Number of Children: 2  . Years of Education: N/A    Occupational History  . pharmaceuticals    Social History Main Topics  . Smoking status: Former Games developermoker  . Smokeless tobacco: Never Used     Comment: quit 2009  . Alcohol Use: Yes     Comment: occasional  . Drug Use: No  . Sexual Activity: Not on file   Other Topics Concern  . Not on file   Social History Narrative   Married, 2 children (2004, 2003), original from [place]            Family History  Problem Relation Age of Onset  . Cancer - Colon Neg Hx   . Cancer - Prostate Neg Hx   . CAD Neg Hx   . Hypertension Mother   . Stroke Neg Hx   . Diabetes Father     F late onset         Medication List       This list is accurate as of: 01/03/16 11:59 PM.  Always use your most recent med list.               aspirin 81 MG tablet  Take 81 mg by mouth daily.     hydrocortisone 2.5 % cream  Apply topically 2 (two) times daily.     losartan 25 MG tablet  Commonly known as:  COZAAR  Take 1 tablet (25 mg total) by mouth daily.  Objective:   Physical Exam BP 106/88 mmHg  Pulse 63  Temp(Src) 97.9 F (36.6 C) (Oral)  Ht  (1.778 m)  Wt 185 lb 9.6 oz (84.188 kg)  BMI 26.63 kg/m2  SpO2 98%  General:   Well developed, well nourished . NAD.  Neck: No  Thyromegaly  HEENT:  Normocephalic . Face symmetric, atraumatic Lungs:  CTA B Normal respiratory effort, no intercostal retractions, no accessory muscle use. Heart: RRR,  no murmur.  No pretibial edema bilaterally  Abdomen:  Not distended, soft, non-tender. No rebound or rigidity.   Skin: Exposed areas without rash. Not pale. Not jaundice Neurologic:  alert & oriented X3.  Speech normal, gait appropriate for age and unassisted Strength symmetric and appropriate for age.  Psych: Cognition and judgment appear intact.  Cooperative with normal attention span and concentration.  Behavior appropriate. No anxious or depressed appearing.    Assessment & Plan:   Assessment HTN Chest pain,  2010, (-) stress echo w/ incidental patent foramen ovale Palpitations: saw Cardiology 2015 Rash, sun related--on hydrocortisone  Prn, sun blockers   PLAN: HTN: Well-controlled, continue losartan RTC one year

## 2016-01-03 NOTE — Progress Notes (Signed)
Pre visit review using our clinic tool,if applicable. No additional management support is needed unless otherwise documented below in the visit note.  

## 2016-01-03 NOTE — Patient Instructions (Signed)
  GO TO THE LAB : Get the blood work     GO TO THE FRONT DESK Schedule your next appointment for a  physical exam in one year, fasting   Check the  blood pressure 2 or 3 times a month   Be sure your blood pressure is between 110/65 and  145/85. If it is consistently higher or lower, let me know   

## 2016-01-03 NOTE — Assessment & Plan Note (Signed)
Td 2013 Will check CBC, FLP, CMP    Continue with his healthy diet and exercise Paperwork completed for work Patient instructed to return to clinic in one year.

## 2016-01-04 DIAGNOSIS — Z09 Encounter for follow-up examination after completed treatment for conditions other than malignant neoplasm: Secondary | ICD-10-CM | POA: Insufficient documentation

## 2016-01-04 NOTE — Assessment & Plan Note (Signed)
PLAN: HTN: Well-controlled, continue losartan RTC one year

## 2016-12-05 ENCOUNTER — Other Ambulatory Visit: Payer: Self-pay | Admitting: Internal Medicine

## 2016-12-30 ENCOUNTER — Encounter: Payer: Self-pay | Admitting: Internal Medicine

## 2017-01-06 ENCOUNTER — Ambulatory Visit (INDEPENDENT_AMBULATORY_CARE_PROVIDER_SITE_OTHER): Payer: 59 | Admitting: Internal Medicine

## 2017-01-06 ENCOUNTER — Encounter: Payer: Self-pay | Admitting: Internal Medicine

## 2017-01-06 VITALS — BP 124/70 | HR 61 | Temp 98.2°F | Resp 14 | Ht 70.0 in | Wt 193.4 lb

## 2017-01-06 DIAGNOSIS — Z0000 Encounter for general adult medical examination without abnormal findings: Secondary | ICD-10-CM | POA: Diagnosis not present

## 2017-01-06 MED ORDER — HYDROCORTISONE 2.5 % EX CREA: TOPICAL_CREAM | Freq: Two times a day (BID) | CUTANEOUS | 3 refills | Status: DC

## 2017-01-06 MED ORDER — LOSARTAN POTASSIUM 25 MG PO TABS: 25.0000 mg | ORAL_TABLET | Freq: Every day | ORAL | 3 refills | Status: DC

## 2017-01-06 NOTE — Progress Notes (Signed)
Pre visit review using our clinic review tool, if applicable. No additional management support is needed unless otherwise documented below in the visit note. 

## 2017-01-06 NOTE — Patient Instructions (Signed)
GO TO THE LAB : Get the blood work     GO TO THE FRONT DESK Schedule your next appointment for a  Physical exam in 1 year     Check the  blood pressure   Monthly to  Weekly  Be sure your blood pressure is between 110/65 and  140/85. If it is consistently higher or lower, let me know

## 2017-01-06 NOTE — Progress Notes (Signed)
Subjective:    Patient ID: Elijah Kelly, male    DOB: Apr 28, 1971, 46 y.o.   MRN: [id number]  DOS:  01/06/2017 Type of visit - description : cpx Interval history:No major concerns, feeling well.    Review of Systems Occasional rash, sun-related, needs a prescription for the cream   Other than above, a 14 point review of systems is negative     Past Medical History:  Diagnosis Date  . Chest pain 02-2009    normal  stress ECHO w/ incidental patent foramen ovale  . HTN (hypertension)   . Palpitation    saw cards 2015    Past Surgical History:  Procedure Laterality Date  . ADENOIDECTOMY      Social History   Social History  . Marital status: Married    Spouse name: N/A  . Number of children: 2  . Years of education: N/A   Occupational History  . pharmaceuticals    Social History Main Topics  . Smoking status: Former Games developer  . Smokeless tobacco: Never Used     Comment: quit 2009  . Alcohol use Yes     Comment: occasional  . Drug use: No  . Sexual activity: Not on file   Other Topics Concern  . Not on file   Social History Narrative   Married, 2 children (2004, 2003), original from [place]            Family History  Problem Relation Age of Onset  . Hypertension Mother   . Diabetes Father     F late onset   . Cancer - Colon Neg Hx   . Cancer - Prostate Neg Hx   . CAD Neg Hx   . Stroke Neg Hx       Allergies as of 01/06/2017   No Known Allergies     Medication List       Accurate as of 01/06/17 11:59 PM. Always use your most recent med list.          aspirin 81 MG tablet Take 81 mg by mouth daily.   hydrocortisone 2.5 % cream Apply topically 2 (two) times daily.   losartan 25 MG tablet Commonly known as:  COZAAR Take 1 tablet (25 mg total) by mouth daily.          Objective:   Physical Exam BP 124/70 (BP Location: Left Arm, Patient Position: Sitting, Cuff Size: Normal)   Pulse 61   Temp 98.2 F (36.8 C) (Oral)   Resp 14   Ht 5'  10" (1.778 m)   Wt 193 lb 6 oz (87.7 kg)   SpO2 96%   BMI 27.75 kg/m   General:   Well developed, well nourished . NAD.  Neck: No  thyromegaly  HEENT:  Normocephalic . Face symmetric, atraumatic Lungs:  CTA B Normal respiratory effort, no intercostal retractions, no accessory muscle use. Heart: RRR,  no murmur.  No pretibial edema bilaterally  Abdomen:  Not distended, soft, non-tender. No rebound or rigidity.   Skin: Exposed areas without rash. Not pale. Not jaundice Neurologic:  alert & oriented X3.  Speech normal, gait appropriate for age and unassisted Strength symmetric and appropriate for age.  Psych: Cognition and judgment appear intact.  Cooperative with normal attention span and concentration.  Behavior appropriate. No anxious or depressed appearing.    Assessment & Plan:    Assessment HTN Chest pain, 2010, (-) stress echo w/ incidental patent foramen ovale Palpitations: saw Cardiology 2015 Rash, sun related--on  hydrocortisone  Prn, sun blockers   PLAN: HTN: On losartan, checks BPs weekly, well controlled. Refill, labs. Rash: Refill cream to be used needed RTC one year

## 2017-01-06 NOTE — Assessment & Plan Note (Addendum)
--  Td 2013 --CCS: not indicated  -- prostate ca screening : not indicated  -- diet and exercise: he is active, eat healthy most of the time, keeps wt ~ 190 at home  --Paperwork - signed, pt will fill the labs when available --Labs: CMP, FLP, CBC, TSH

## 2017-01-07 LAB — COMPREHENSIVE METABOLIC PANEL
ALT: 13 U/L (ref 0–53)
AST: 11 U/L (ref 0–37)
Albumin: 4.5 g/dL (ref 3.5–5.2)
Alkaline Phosphatase: 64 U/L (ref 39–117)
BILIRUBIN TOTAL: 1.2 mg/dL (ref 0.2–1.2)
BUN: 14 mg/dL (ref 6–23)
CALCIUM: 9.5 mg/dL (ref 8.4–10.5)
CO2: 30 meq/L (ref 19–32)
Chloride: 104 mEq/L (ref 96–112)
Creatinine, Ser: 0.95 mg/dL (ref 0.40–1.50)
GFR: 90.26 mL/min (ref >60.00)
Glucose, Bld: 82 mg/dL (ref 70–99)
Potassium: 4.5 mEq/L (ref 3.5–5.1)
Sodium: 141 mEq/L (ref 135–145)
Total Protein: 7.7 g/dL (ref 6.0–8.3)

## 2017-01-07 LAB — LIPID PANEL
CHOL/HDL RATIO: 3
Cholesterol: 136 mg/dL (ref 0–200)
HDL: 47.4 mg/dL (ref >39.00)
LDL Cholesterol: 79 mg/dL (ref 0–99)
NonHDL: 88.92
TRIGLYCERIDES: 51 mg/dL (ref 0.0–149.0)
VLDL: 10.2 mg/dL (ref 0.0–40.0)

## 2017-01-07 LAB — CBC WITH DIFFERENTIAL/PLATELET
BASOS ABS: 0.1 10*3/uL (ref 0.0–0.1)
BASOS PCT: 1.5 % (ref 0.0–3.0)
EOS PCT: 3 % (ref 0.0–5.0)
Eosinophils Absolute: 0.1 10*3/uL (ref 0.0–0.7)
HCT: 41.5 % (ref 39.0–52.0)
Hemoglobin: 13.7 g/dL (ref 13.0–17.0)
LYMPHS ABS: 2 10*3/uL (ref 0.7–4.0)
Lymphocytes Relative: 40.5 % (ref 12.0–46.0)
MCHC: 33.1 g/dL (ref 30.0–36.0)
MCV: 88.7 fl (ref 78.0–100.0)
MONOS PCT: 6.9 % (ref 3.0–12.0)
Monocytes Absolute: 0.3 10*3/uL (ref 0.1–1.0)
NEUTROS ABS: 2.3 10*3/uL (ref 1.4–7.7)
NEUTROS PCT: 48.1 % (ref 43.0–77.0)
PLATELETS: 235 10*3/uL (ref 150.0–400.0)
RBC: 4.68 Mil/uL (ref 4.22–5.81)
RDW: 13.9 % (ref 11.5–15.5)
WBC: 4.8 10*3/uL (ref 4.0–10.5)

## 2017-01-07 LAB — TSH: TSH: 1.03 u[IU]/mL (ref 0.35–4.50)

## 2017-01-07 NOTE — Assessment & Plan Note (Signed)
HTN: On losartan, checks BPs weekly, well controlled. Refill, labs. Rash: Refill cream to be used needed RTC one year

## 2017-11-16 ENCOUNTER — Ambulatory Visit (INDEPENDENT_AMBULATORY_CARE_PROVIDER_SITE_OTHER): Payer: Managed Care, Other (non HMO) | Admitting: Internal Medicine

## 2017-11-16 ENCOUNTER — Encounter: Payer: Self-pay | Admitting: Internal Medicine

## 2017-11-16 VITALS — BP 124/68 | HR 66 | Temp 98.2°F | Resp 14 | Ht 70.0 in | Wt 196.5 lb

## 2017-11-16 DIAGNOSIS — Z0000 Encounter for general adult medical examination without abnormal findings: Secondary | ICD-10-CM

## 2017-11-16 MED ORDER — LOSARTAN POTASSIUM 25 MG PO TABS: 25.0000 mg | ORAL_TABLET | Freq: Every day | ORAL | 3 refills | Status: DC

## 2017-11-16 NOTE — Assessment & Plan Note (Signed)
--  Td 2013 --CCS: not indicated  -- prostate ca screening : not indicated  -- diet and exercise: doing well  --Labs: CMP, FLP, A1c

## 2017-11-16 NOTE — Patient Instructions (Signed)
GO TO THE LAB : Get the blood work     GO TO THE FRONT DESK Schedule your next appointment for a physical exam in 1 year  Check the  blood pressure 2 or 3 times a month   Be sure your blood pressure is between 110/65 and  135/85. If it is consistently higher or lower, let me know  If you like to try without losartan that is okay.  As long as you stop but your blood pressure remain normal is okay.  Please call with any questions.

## 2017-11-16 NOTE — Progress Notes (Signed)
Subjective:    Patient ID: Elijah Kelly, male    DOB: 01/30/1971, 47 y.o.   MRN: [id number]  DOS:  11/16/2017 Type of visit - description : CPX Interval history: In general feeling well, no major concerns. He gained some weight during Christmas, he is already losing a few pounds by eating healthier.  Wt Readings from Last 3 Encounters:  11/16/17 196 lb 8 oz (89.1 kg)  01/06/17 193 lb 6 oz (87.7 kg)  01/03/16 185 lb 9.6 oz (84.2 kg)     Review of Systems  Other than above, a 14 point review of systems is negative     Past Medical History:  Diagnosis Date  . Chest pain 02-2009    normal  stress ECHO w/ incidental patent foramen ovale  . HTN (hypertension)   . Palpitation    saw cards 2015    Past Surgical History:  Procedure Laterality Date  . ADENOIDECTOMY      Social History   Socioeconomic History  . Marital status: Married    Spouse name: Not on file  . Number of children: 2  . Years of education: Not on file  . Highest education level: Not on file  Social Needs  . Financial resource strain: Not on file  . Food insecurity - worry: Not on file  . Food insecurity - inability: Not on file  . Transportation needs - medical: Not on file  . Transportation needs - non-medical: Not on file  Occupational History  . Occupation: pharmaceuticals  Tobacco Use  . Smoking status: Former Games developermoker  . Smokeless tobacco: Never Used  . Tobacco comment: quit 2009  Substance and Sexual Activity  . Alcohol use: Yes    Comment: occasional  . Drug use: No  . Sexual activity: Not on file  Other Topics Concern  . Not on file  Social History Narrative   Married, 2 children (2004, 2003), original from [place]           Family History  Problem Relation Age of Onset  . Hypertension Mother   . Diabetes Father        F late onset   . Cancer - Colon Neg Hx   . Cancer - Prostate Neg Hx   . CAD Neg Hx   . Stroke Neg Hx      Allergies as of 11/16/2017   No Known Allergies     Medication List        Accurate as of 11/16/17 11:59 PM. Always use your most recent med list.          aspirin 81 MG tablet Take 81 mg by mouth daily.   losartan 25 MG tablet Commonly known as:  COZAAR Take 1 tablet (25 mg total) by mouth daily.          Objective:   Physical Exam BP 124/68 (BP Location: Left Arm, Patient Position: Sitting, Cuff Size: Normal)   Pulse 66   Temp 98.2 F (36.8 C) (Oral) Comment (Src): o  Resp 14   Ht 5\' 10"  (1.778 m)   Wt 196 lb 8 oz (89.1 kg)   SpO2 97%   BMI 28.19 kg/m  General:   Well developed, well nourished . NAD.  Neck: No  thyromegaly  HEENT:  Normocephalic . Face symmetric, atraumatic Lungs:  CTA B Normal respiratory effort, no intercostal retractions, no accessory muscle use. Heart: RRR,  no murmur.  No pretibial edema bilaterally  Abdomen:  Not distended, soft, non-tender. No  rebound or rigidity.   Skin: Exposed areas without rash. Not pale. Not jaundice Neurologic:  alert & oriented X3.  Speech normal, gait appropriate for age and unassisted Strength symmetric and appropriate for age.  Psych: Cognition and judgment appear intact.  Cooperative with normal attention span and concentration.  Behavior appropriate. No anxious or depressed appearing.     Assessment & Plan:   Assessment HTN Chest pain, 2010, (-) stress echo w/ incidental patent foramen ovale Palpitations: saw Cardiology 2015 Rash, sun related--on hydrocortisone  Prn, sun blockers   PLAN: HTN: On losartan 25 mg, BP today is very good, at home is only 125-130/80.  Patient wonders if at some point he could stop losartan.  He is taking very low-dose, okay to do a trial without it if so desired. See instructions. RTC 1 year

## 2017-11-16 NOTE — Progress Notes (Signed)
Pre visit review using our clinic review tool, if applicable. No additional management support is needed unless otherwise documented below in the visit note. 

## 2017-11-17 LAB — COMPREHENSIVE METABOLIC PANEL
ALBUMIN: 4.5 g/dL (ref 3.5–5.2)
ALK PHOS: 63 U/L (ref 39–117)
ALT: 20 U/L (ref 0–53)
AST: 12 U/L (ref 0–37)
BUN: 15 mg/dL (ref 6–23)
CALCIUM: 9.5 mg/dL (ref 8.4–10.5)
CHLORIDE: 105 meq/L (ref 96–112)
CO2: 28 mEq/L (ref 19–32)
Creatinine, Ser: 0.9 mg/dL (ref 0.40–1.50)
GFR: 95.72 mL/min (ref >60.00)
Glucose, Bld: 83 mg/dL (ref 70–99)
Potassium: 4.5 mEq/L (ref 3.5–5.1)
Sodium: 141 mEq/L (ref 135–145)
TOTAL PROTEIN: 7.6 g/dL (ref 6.0–8.3)
Total Bilirubin: 0.8 mg/dL (ref 0.2–1.2)

## 2017-11-17 LAB — LIPID PANEL
CHOLESTEROL: 144 mg/dL (ref 0–200)
HDL: 40.8 mg/dL (ref >39.00)
LDL Cholesterol: 87 mg/dL (ref 0–99)
NonHDL: 103.67
TRIGLYCERIDES: 84 mg/dL (ref 0.0–149.0)
Total CHOL/HDL Ratio: 4
VLDL: 16.8 mg/dL (ref 0.0–40.0)

## 2017-11-17 LAB — HEMOGLOBIN A1C: HEMOGLOBIN A1C: 5.8 % (ref 4.6–6.5)

## 2017-11-17 NOTE — Assessment & Plan Note (Signed)
HTN: On losartan 25 mg, BP today is very good, at home is only 125-130/80.  Patient wonders if at some point he could stop losartan.  He is taking very low-dose, okay to do a trial without it if so desired. See instructions. RTC 1 year

## 2018-11-13 ENCOUNTER — Other Ambulatory Visit: Payer: Self-pay | Admitting: Internal Medicine

## 2018-11-18 ENCOUNTER — Encounter: Payer: Managed Care, Other (non HMO) | Admitting: Internal Medicine

## 2018-12-23 ENCOUNTER — Other Ambulatory Visit: Payer: Self-pay

## 2018-12-23 ENCOUNTER — Encounter: Payer: Self-pay | Admitting: Internal Medicine

## 2018-12-23 ENCOUNTER — Ambulatory Visit (INDEPENDENT_AMBULATORY_CARE_PROVIDER_SITE_OTHER): Payer: Managed Care, Other (non HMO) | Admitting: Internal Medicine

## 2018-12-23 DIAGNOSIS — Z0000 Encounter for general adult medical examination without abnormal findings: Secondary | ICD-10-CM | POA: Diagnosis not present

## 2018-12-23 DIAGNOSIS — R739 Hyperglycemia, unspecified: Secondary | ICD-10-CM | POA: Diagnosis not present

## 2018-12-23 MED ORDER — LOSARTAN POTASSIUM 50 MG PO TABS: 50.0000 mg | ORAL_TABLET | Freq: Every day | ORAL | 1 refills | Status: DC

## 2018-12-23 NOTE — Progress Notes (Signed)
Subjective:    Patient ID: Elijah Kelly, male    DOB: [birth year], 48 y.o.   MRN: [id number]  DOS:  12/23/2018 Type of visit - description:  Virtual Visit via Video Note  I connected with@ on 12/24/18 at  1:20 PM EDT by a video enabled telemedicine application and verified that I am speaking with the correct person using two identifiers.   THIS ENCOUNTER IS A VIRTUAL VISIT DUE TO COVID-19 - PATIENT WAS NOT SEEN IN THE OFFICE. PATIENT HAS CONSENTED TO VIRTUAL VISIT / TELEMEDICINE VISIT   Location of patient: home  Location of provider: office  I discussed the limitations of evaluation and management by telemedicine and the availability of in person appointments. The patient expressed understanding and agreed to proceed.  History of Present Illness: CPX In general feeling well. Is his observation that for the last 2 weeks he woke up with headache every morning, it lasted about 2 hours, decreased with OTCs. Although it was not the worst headache of his life, it was present for several days Headache has resolved for the last 2 days, was not associated with nausea or vomiting.  Coronavirus: Following the CDC recommendations for prevention. HTN: Good compliance with medication, BPs are checked sporadically, over the last month he has been getting few readings in the 145/105 levels.  Admits that he is not exercising as much, used to play soccer but that is now that is not possible due to coronavirus.  Review of Systems  Other than above 14 point review of systems is negative; specifically no fever, chills, chest pain, palpitations, LUTS.  Past Medical History:  Diagnosis Date  . Chest pain 02-2009    normal  stress ECHO w/ incidental patent foramen ovale  . HTN (hypertension)   . Palpitation    saw cards 2015    Past Surgical History:  Procedure Laterality Date  . ADENOIDECTOMY      Social History   Socioeconomic History  . Marital status: Married    Spouse name: Not on file   . Number of children: 2  . Years of education: Not on file  . Highest education level: Not on file  Occupational History  . Occupation: pharmaceuticals  Social Needs  . Financial resource strain: Not on file  . Food insecurity:    Worry: Not on file    Inability: Not on file  . Transportation needs:    Medical: Not on file    Non-medical: Not on file  Tobacco Use  . Smoking status: Former Games developer  . Smokeless tobacco: Never Used  . Tobacco comment: quit 2009  Substance and Sexual Activity  . Alcohol use: Yes    Comment: occasional  . Drug use: No  . Sexual activity: Not on file  Lifestyle  . Physical activity:    Days per week: Not on file    Minutes per session: Not on file  . Stress: Not on file  Relationships  . Social connections:    Talks on phone: Not on file    Gets together: Not on file    Attends religious service: Not on file    Active member of club or organization: Not on file    Attends meetings of clubs or organizations: Not on file    Relationship status: Not on file  . Intimate partner violence:    Fear of current or ex partner: Not on file    Emotionally abused: Not on file    Physically abused: Not on  file    Forced sexual activity: Not on file  Other Topics Concern  . Not on file  Social History Narrative   Married, 2 children (2004, 2003), original from [place]         Family History  Problem Relation Age of Onset  . Hypertension Mother   . Diabetes Father        F late onset   . Cancer - Colon Neg Hx   . Cancer - Prostate Neg Hx   . CAD Neg Hx   . Stroke Neg Hx      Allergies as of 12/23/2018   No Known Allergies     Medication List       Accurate as of December 23, 2018 11:59 PM. Always use your most recent med list.        aspirin 81 MG tablet Take 81 mg by mouth daily.   losartan 50 MG tablet Commonly known as:  COZAAR Take 1 tablet (50 mg total) by mouth daily.           Objective:   Physical Exam There were no  vitals taken for this visit. This is a virtual video visit, alert oriented x3, in no apparent distress.  Speech is normal, face seem symmetric.    Assessment     Assessment HTN Chest pain, 2010, (-) stress echo w/ incidental patent foramen ovale Palpitations: saw Cardiology 2015.  Cardiac event monitor 2015 essentially negative Rash, sun related--on hydrocortisone  Prn, sun blockers   PLAN: Video visit CPX HTN: Slightly elevated over the last month, at the time of this visit the patient check his BP: 149/105.  I wonder if due to the quarantine, he is less active and eating less healthy.  Plan: Increase losartan to 50 mg, continue checking BPs. Headache: As described above, persisting but not the worst of his life.  No headache for 2 days.  When asked, admits to some stress, work related. Headache could be d/t stress or  increased BP . Patient has a history of PFO and a higher  risk of migraine and other neurological events thus will need prompt reevaluation if the sxs resurface.. Since he seems to be doing well, will work on his blood pressure patient will call if symptoms return. RTC: 2 weeks for labs 4 months for a face-to-face visit My nurse will schedule   I discussed the assessment and treatment plan with the patient. The patient was provided an opportunity to ask questions and all were answered. The patient agreed with the plan and demonstrated an understanding of the instructions.   The patient was advised to call back or seek an in-person evaluation if the symptoms worsen or if the condition fails to improve as anticipated.

## 2018-12-23 NOTE — Assessment & Plan Note (Addendum)
--  Td 2013, had a flu shot 06-2018 --CCS: not indicated  -- prostate ca screening : not indicated  -- diet and exercise: Has not been able play soccer due to coronavirus, diet with room for improvement.  Counseled. --Labs: CMP, FL P, CBC, A1c

## 2018-12-24 NOTE — Assessment & Plan Note (Signed)
Video visit CPX HTN: Slightly elevated over the last month, at the time of this visit the patient check his BP: 149/105.  I wonder if due to the quarantine, he is less active and eating less healthy.  Plan: Increase losartan to 50 mg, continue checking BPs. Headache: As described above, persisting but not the worst of his life.  No headache for 2 days.  When asked, admits to some stress, work related. Headache could be d/t stress or  increased BP . Patient has a history of PFO and a higher  risk of migraine and other neurological events thus will need prompt reevaluation if the sxs resurface.. Since he seems to be doing well, will work on his blood pressure patient will call if symptoms return. RTC: 2 weeks for labs 4 months for a face-to-face visit My nurse will schedule

## 2018-12-26 ENCOUNTER — Encounter: Payer: Managed Care, Other (non HMO) | Admitting: Internal Medicine

## 2019-01-05 ENCOUNTER — Other Ambulatory Visit (INDEPENDENT_AMBULATORY_CARE_PROVIDER_SITE_OTHER): Payer: Managed Care, Other (non HMO)

## 2019-01-05 ENCOUNTER — Other Ambulatory Visit: Payer: Self-pay

## 2019-01-05 DIAGNOSIS — Z0000 Encounter for general adult medical examination without abnormal findings: Secondary | ICD-10-CM | POA: Diagnosis not present

## 2019-01-05 DIAGNOSIS — R739 Hyperglycemia, unspecified: Secondary | ICD-10-CM

## 2019-01-05 LAB — CBC WITH DIFFERENTIAL/PLATELET
Basophils Absolute: 0.1 10*3/uL (ref 0.0–0.1)
Basophils Relative: 1 % (ref 0.0–3.0)
Eosinophils Absolute: 0.2 10*3/uL (ref 0.0–0.7)
Eosinophils Relative: 4.6 % (ref 0.0–5.0)
HCT: 42.6 % (ref 39.0–52.0)
Hemoglobin: 14.4 g/dL (ref 13.0–17.0)
Lymphocytes Relative: 43 % (ref 12.0–46.0)
Lymphs Abs: 2.3 10*3/uL (ref 0.7–4.0)
MCHC: 33.9 g/dL (ref 30.0–36.0)
MCV: 87.8 fl (ref 78.0–100.0)
Monocytes Absolute: 0.5 10*3/uL (ref 0.1–1.0)
Monocytes Relative: 8.5 % (ref 3.0–12.0)
Neutro Abs: 2.3 10*3/uL (ref 1.4–7.7)
Neutrophils Relative %: 42.9 % — ABNORMAL LOW (ref 43.0–77.0)
Platelets: 219 10*3/uL (ref 150.0–400.0)
RBC: 4.86 Mil/uL (ref 4.22–5.81)
RDW: 13.4 % (ref 11.5–15.5)
WBC: 5.3 10*3/uL (ref 4.0–10.5)

## 2019-01-05 LAB — LIPID PANEL
Cholesterol: 166 mg/dL (ref 0–200)
HDL: 37.5 mg/dL — ABNORMAL LOW (ref >39.00)
LDL Cholesterol: 110 mg/dL — ABNORMAL HIGH (ref 0–99)
NonHDL: 128.66
Total CHOL/HDL Ratio: 4
Triglycerides: 91 mg/dL (ref 0.0–149.0)
VLDL: 18.2 mg/dL (ref 0.0–40.0)

## 2019-01-05 LAB — COMPREHENSIVE METABOLIC PANEL
ALT: 43 U/L (ref 0–53)
AST: 17 U/L (ref 0–37)
Albumin: 4.4 g/dL (ref 3.5–5.2)
Alkaline Phosphatase: 52 U/L (ref 39–117)
BUN: 16 mg/dL (ref 6–23)
CO2: 28 mEq/L (ref 19–32)
Calcium: 9.1 mg/dL (ref 8.4–10.5)
Chloride: 104 mEq/L (ref 96–112)
Creatinine, Ser: 0.93 mg/dL (ref 0.40–1.50)
GFR: 86.31 mL/min (ref >60.00)
Glucose, Bld: 96 mg/dL (ref 70–99)
Potassium: 4.2 mEq/L (ref 3.5–5.1)
Sodium: 139 mEq/L (ref 135–145)
Total Bilirubin: 1.1 mg/dL (ref 0.2–1.2)
Total Protein: 7.2 g/dL (ref 6.0–8.3)

## 2019-01-05 LAB — HEMOGLOBIN A1C: Hgb A1c MFr Bld: 6.2 % (ref 4.6–6.5)

## 2019-01-19 ENCOUNTER — Telehealth: Payer: Self-pay | Admitting: Internal Medicine

## 2019-01-19 ENCOUNTER — Encounter: Payer: Self-pay | Admitting: Internal Medicine

## 2019-01-19 NOTE — Telephone Encounter (Signed)
Copied from CRM [id number]. Topic: General - Other >> Jan 19, 2019 11:55 AM Doreatha Massed wrote: Reason for CRM: pt had labs done 4.30.20 for his annual and was charged b/c he received a bill. Pt want to know why b/c he usually don't get charged.

## 2019-01-20 NOTE — Telephone Encounter (Signed)
Can you look into this?

## 2019-01-23 ENCOUNTER — Encounter: Payer: Self-pay | Admitting: Internal Medicine

## 2019-01-27 NOTE — Telephone Encounter (Signed)
Labs done 01/05/2019 CBC, FLP, CMP, A1c: Can we recode w/ the dx of CPX?

## 2019-02-03 NOTE — Telephone Encounter (Signed)
Hi Shannon,  Yes, I can request to have dx changed to CPX dx for the labs.  His documentation supports this as reason for tests.  Thanks, Marcelino Duster

## 2019-02-08 ENCOUNTER — Encounter: Payer: Self-pay | Admitting: Internal Medicine

## 2019-05-08 ENCOUNTER — Encounter: Payer: Self-pay | Admitting: Internal Medicine

## 2019-05-08 ENCOUNTER — Other Ambulatory Visit: Payer: Self-pay

## 2019-05-08 ENCOUNTER — Ambulatory Visit: Payer: Managed Care, Other (non HMO) | Admitting: Internal Medicine

## 2019-05-08 VITALS — BP 136/99 | HR 52 | Temp 96.8°F | Resp 16 | Ht 70.0 in | Wt 188.1 lb

## 2019-05-08 DIAGNOSIS — I10 Essential (primary) hypertension: Secondary | ICD-10-CM | POA: Diagnosis not present

## 2019-05-08 LAB — LIPID PANEL
Cholesterol: 146 mg/dL (ref 0–200)
HDL: 48.3 mg/dL (ref >39.00)
LDL Cholesterol: 83 mg/dL (ref 0–99)
NonHDL: 97.34
Total CHOL/HDL Ratio: 3
Triglycerides: 72 mg/dL (ref 0.0–149.0)
VLDL: 14.4 mg/dL (ref 0.0–40.0)

## 2019-05-08 MED ORDER — LOSARTAN POTASSIUM 50 MG PO TABS: 50.0000 mg | ORAL_TABLET | Freq: Every day | ORAL | 2 refills | Status: DC

## 2019-05-08 NOTE — Assessment & Plan Note (Signed)
HTN: Since the last visit, we increased losartan to 50 mg daily, ambulatory BPs 130 over 80s, occasionally in the 90s.  We agreed on check BPs weekly, will call if not at goal, see AVS.  Last BMP satisfactory Headache: See last visit, resolved Elevated cholesterol: Last LDL increased, doing better with diet, has lost weight, check FLP Flu shot: We will get at work RTC by 12-2019 CPX, sooner if BP is not well controlled.

## 2019-05-08 NOTE — Progress Notes (Signed)
Pre visit review using our clinic review tool, if applicable. No additional management support is needed unless otherwise documented below in the visit note. 

## 2019-05-08 NOTE — Progress Notes (Signed)
Subjective:    Patient ID: Elijah Kelly, male    DOB: Oct 13, 1970, 48 y.o.   MRN: [id number]  DOS:  05/08/2019 Type of visit - description: Follow-up from last visit Headaches: Resolved HTN: Losartan dose increased, ambulatory BPs reviewed Stress: Still there but he is managing well.  BP Readings from Last 3 Encounters:  05/08/19 (!) 136/99  11/16/17 124/68  01/06/17 124/70   Wt Readings from Last 3 Encounters:  05/08/19 188 lb 2 oz (85.3 kg)  11/16/17 196 lb 8 oz (89.1 kg)  01/06/17 193 lb 6 oz (87.7 kg)     Review of Systems Denies chest pain or difficulty breathing No lower extremity edema Working on his diet, has lost some weight.  Past Medical History:  Diagnosis Date  . Chest pain 02-2009    normal  stress ECHO w/ incidental patent foramen ovale  . HTN (hypertension)   . Palpitation    saw cards 2015    Past Surgical History:  Procedure Laterality Date  . ADENOIDECTOMY      Social History   Socioeconomic History  . Marital status: Married    Spouse name: Not on file  . Number of children: 2  . Years of education: Not on file  . Highest education level: Not on file  Occupational History  . Occupation: pharmaceuticals  Social Needs  . Financial resource strain: Not on file  . Food insecurity    Worry: Not on file    Inability: Not on file  . Transportation needs    Medical: Not on file    Non-medical: Not on file  Tobacco Use  . Smoking status: Former Research scientist (life sciences)  . Smokeless tobacco: Never Used  . Tobacco comment: quit 2009  Substance and Sexual Activity  . Alcohol use: Yes    Comment: occasional  . Drug use: No  . Sexual activity: Not on file  Lifestyle  . Physical activity    Days per week: Not on file    Minutes per session: Not on file  . Stress: Not on file  Relationships  . Social Herbalist on phone: Not on file    Gets together: Not on file    Attends religious service: Not on file    Active member of club or organization:  Not on file    Attends meetings of clubs or organizations: Not on file    Relationship status: Not on file  . Intimate partner violence    Fear of current or ex partner: Not on file    Emotionally abused: Not on file    Physically abused: Not on file    Forced sexual activity: Not on file  Other Topics Concern  . Not on file  Social History Narrative   Married, 2 children (2004, 2003), original from [place] as of 05/08/2019   No Known Allergies     Medication List       Accurate as of May 08, 2019  4:31 PM. If you have any questions, ask your nurse or doctor.        aspirin 81 MG tablet Take 81 mg by mouth daily.   losartan 50 MG tablet Commonly known as: COZAAR Take 1 tablet (50 mg total) by mouth daily.           Objective:   Physical Exam BP (!) 136/99 (BP Location: Left Arm, Patient Position: Sitting, Cuff Size: Normal)  Pulse (!) 52   Temp (!) 96.8 F (36 C) (Temporal)   Resp 16   Ht 5\' 10"  (1.778 m)   Wt 188 lb 2 oz (85.3 kg)   SpO2 100%   BMI 26.99 kg/m   General:   Well developed, NAD, BMI noted. HEENT:  Normocephalic . Face symmetric, atraumatic Lungs:  CTA B Normal respiratory effort, no intercostal retractions, no accessory muscle use. Heart: RRR,  no murmur.  No pretibial edema bilaterally  Skin: Not pale. Not jaundice Neurologic:  alert & oriented X3.  Speech normal, gait appropriate for age and unassisted Psych--  Cognition and judgment appear intact.  Cooperative with normal attention span and concentration.  Behavior appropriate. No anxious or depressed appearing.      Assessment    Assessment HTN Chest pain, 2010, (-) stress echo w/ incidental patent foramen ovale Palpitations: saw Cardiology 2015.  Cardiac event monitor 2015 essentially negative Rash, sun related--on hydrocortisone  Prn, sun blockers   PLAN: HTN: Since the last visit, we increased losartan to 50 mg daily, ambulatory BPs 130 over  80s, occasionally in the 90s.  We agreed on check BPs weekly, will call if not at goal, see AVS.  Last BMP satisfactory Headache: See last visit, resolved Elevated cholesterol: Last LDL increased, doing better with diet, has lost weight, check FLP Flu shot: We will get at work RTC by 12-2019 CPX, sooner if BP is not well controlled.

## 2019-05-08 NOTE — Patient Instructions (Addendum)
GO TO THE LAB : Get the blood work     GO TO THE FRONT DESK Schedule your next appointment for a physical by 12/2019       Check the  blood pressure   weekly   BP GOAL is between 110/65 and  135/85. If it is consistently higher or lower, let me know

## 2019-10-24 ENCOUNTER — Other Ambulatory Visit: Payer: Self-pay

## 2019-10-24 ENCOUNTER — Encounter: Payer: Self-pay | Admitting: Internal Medicine

## 2019-10-24 ENCOUNTER — Ambulatory Visit (INDEPENDENT_AMBULATORY_CARE_PROVIDER_SITE_OTHER): Payer: 59 | Admitting: Internal Medicine

## 2019-10-24 VITALS — BP 142/99 | HR 64 | Temp 96.0°F | Resp 16 | Ht 70.0 in | Wt 202.0 lb

## 2019-10-24 DIAGNOSIS — I10 Essential (primary) hypertension: Secondary | ICD-10-CM

## 2019-10-24 DIAGNOSIS — Z0000 Encounter for general adult medical examination without abnormal findings: Secondary | ICD-10-CM | POA: Diagnosis not present

## 2019-10-24 DIAGNOSIS — R739 Hyperglycemia, unspecified: Secondary | ICD-10-CM | POA: Diagnosis not present

## 2019-10-24 LAB — CBC WITH DIFFERENTIAL/PLATELET
Basophils Absolute: 0 10*3/uL (ref 0.0–0.1)
Basophils Relative: 0.7 % (ref 0.0–3.0)
Eosinophils Absolute: 0.2 10*3/uL (ref 0.0–0.7)
Eosinophils Relative: 3.2 % (ref 0.0–5.0)
HCT: 43.1 % (ref 39.0–52.0)
Hemoglobin: 14.3 g/dL (ref 13.0–17.0)
Lymphocytes Relative: 42.1 % (ref 12.0–46.0)
Lymphs Abs: 2.3 10*3/uL (ref 0.7–4.0)
MCHC: 33.2 g/dL (ref 30.0–36.0)
MCV: 89.7 fl (ref 78.0–100.0)
Monocytes Absolute: 0.5 10*3/uL (ref 0.1–1.0)
Monocytes Relative: 8.4 % (ref 3.0–12.0)
Neutro Abs: 2.5 10*3/uL (ref 1.4–7.7)
Neutrophils Relative %: 45.6 % (ref 43.0–77.0)
Platelets: 229 10*3/uL (ref 150.0–400.0)
RBC: 4.81 Mil/uL (ref 4.22–5.81)
RDW: 13.3 % (ref 11.5–15.5)
WBC: 5.4 10*3/uL (ref 4.0–10.5)

## 2019-10-24 LAB — COMPREHENSIVE METABOLIC PANEL
ALT: 20 U/L (ref 0–53)
AST: 12 U/L (ref 0–37)
Albumin: 4.5 g/dL (ref 3.5–5.2)
Alkaline Phosphatase: 63 U/L (ref 39–117)
BUN: 20 mg/dL (ref 6–23)
CO2: 30 mEq/L (ref 19–32)
Calcium: 9.2 mg/dL (ref 8.4–10.5)
Chloride: 104 mEq/L (ref 96–112)
Creatinine, Ser: 0.95 mg/dL (ref 0.40–1.50)
GFR: 83.94 mL/min (ref >60.00)
Glucose, Bld: 95 mg/dL (ref 70–99)
Potassium: 4.7 mEq/L (ref 3.5–5.1)
Sodium: 140 mEq/L (ref 135–145)
Total Bilirubin: 1.3 mg/dL — ABNORMAL HIGH (ref 0.2–1.2)
Total Protein: 7.2 g/dL (ref 6.0–8.3)

## 2019-10-24 LAB — LIPID PANEL
Cholesterol: 156 mg/dL (ref 0–200)
HDL: 48.2 mg/dL (ref >39.00)
LDL Cholesterol: 94 mg/dL (ref 0–99)
NonHDL: 108.2
Total CHOL/HDL Ratio: 3
Triglycerides: 72 mg/dL (ref 0.0–149.0)
VLDL: 14.4 mg/dL (ref 0.0–40.0)

## 2019-10-24 LAB — TSH: TSH: 1.09 u[IU]/mL (ref 0.35–4.50)

## 2019-10-24 LAB — HEMOGLOBIN A1C: Hgb A1c MFr Bld: 6.1 % (ref 4.6–6.5)

## 2019-10-24 MED ORDER — LOSARTAN POTASSIUM 100 MG PO TABS: 100.0000 mg | ORAL_TABLET | Freq: Every day | ORAL | 1 refills | Status: DC

## 2019-10-24 MED ORDER — LOSARTAN POTASSIUM 100 MG PO TABS: 100.0000 mg | ORAL_TABLET | Freq: Every day | ORAL | 0 refills | Status: DC

## 2019-10-24 NOTE — Patient Instructions (Addendum)
GO TO THE LAB : Get the blood work     GO TO THE FRONT DESK Schedule labs to be done in 2 to 3 weeks  Come back for a checkup in 6 months  Increase losartan to 100 mg daily Continue checking your blood pressures once a week first, then once monthly BP GOAL is between 110/65 and  135/85. If it is consistently higher or lower, let me know

## 2019-10-24 NOTE — Progress Notes (Signed)
Subjective:    Patient ID: Elijah Kelly, male    DOB: [birth year], 49 y.o.   MRN: [id number]  DOS:  10/24/2019 Type of visit - description: CPX In general feeling well, some stress due to the quarantine etc. but managing okay, less active than before.   Review of Systems  Other than above, a 14 point review of systems is negative    Past Medical History:  Diagnosis Date  . Chest pain 02-2009    normal  stress ECHO w/ incidental patent foramen ovale  . HTN (hypertension)   . Palpitation    saw cards 2015    Past Surgical History:  Procedure Laterality Date  . ADENOIDECTOMY     Family History  Problem Relation Age of Onset  . Hypertension Mother   . Diabetes Father        F late onset   . Cancer - Colon Neg Hx   . Cancer - Prostate Neg Hx   . CAD Neg Hx   . Stroke Neg Hx     Allergies as of 10/24/2019   No Known Allergies     Medication List       Accurate as of October 24, 2019 11:59 PM. If you have any questions, ask your nurse or doctor.        aspirin 81 MG tablet Take 81 mg by mouth daily.   losartan 100 MG tablet Commonly known as: COZAAR Take 1 tablet (100 mg total) by mouth daily. What changed:   medication strength  how much to take Changed by: Willow Ora, MD            Objective:   Physical Exam BP (!) 142/99 (BP Location: Left Arm, Patient Position: Sitting, Cuff Size: Normal)   Pulse 64   Temp (!) 96 F (35.6 C) (Temporal)   Resp 16   Ht 5\' 10"  (1.778 m)   Wt 202 lb (91.6 kg)   SpO2 100%   BMI 28.98 kg/m  General: Well developed, NAD, BMI noted Neck: No  thyromegaly  HEENT:  Normocephalic . Face symmetric, atraumatic Lungs:  CTA B Normal respiratory effort, no intercostal retractions, no accessory muscle use. Heart: RRR,  no murmur.  Abdomen:  Not distended, soft, non-tender. No rebound or rigidity.   Lower extremities: no pretibial edema bilaterally  Skin: Exposed areas without rash. Not pale. Not jaundice Neurologic:   alert & oriented X3.  Speech normal, gait appropriate for age and unassisted Strength symmetric and appropriate for age.  Psych: Cognition and judgment appear intact.  Cooperative with normal attention span and concentration.  Behavior appropriate. No anxious or depressed appearing.     Assessment     Assessment Hyperglycemia: A1c 6.2 (12-2018) HTN Chest pain, 2010, (-) stress echo w/ incidental patent foramen ovale Palpitations: saw Cardiology 2015.  Cardiac event monitor 2015 essentially negative Rash, sun related--on hydrocortisone  Prn, sun blockers   PLAN: HTN: BP slightly elevated upon arrival, I rechecked it 140/95.  At home is typically in the 130s but the DBP often times high 80s or 90s. EKG today: Sinus bradycardia,  no LVH Needs better BP control, increase losartan to 100 mg, continue ambulatory BPs, BMP in 2 weeks. Finasteride?  Like something to help with his hair loss, exam is actually normal, recommend Rogaine first if he must use something. RTC blood work in 3 weeks, follow-up 6 months   This visit occurred during the SARS-CoV-2 public health emergency.  Safety protocols were in place,  including screening questions prior to the visit, additional usage of staff PPE, and extensive cleaning of exam room while observing appropriate contact time as indicated for disinfecting solutions.

## 2019-10-24 NOTE — Progress Notes (Signed)
Pre visit review using our clinic review tool, if applicable. No additional management support is needed unless otherwise documented below in the visit note. 

## 2019-10-25 NOTE — Assessment & Plan Note (Signed)
HTN: BP slightly elevated upon arrival, I rechecked it 140/95.  At home is typically in the 130s but the DBP often times high 80s or 90s. EKG today: Sinus bradycardia,  no LVH Needs better BP control, increase losartan to 100 mg, continue ambulatory BPs, BMP in 2 weeks. Finasteride?  Like something to help with his hair loss, exam is actually normal, recommend Rogaine first if he must use something. RTC blood work in 3 weeks, follow-up 6 months

## 2019-10-25 NOTE — Assessment & Plan Note (Signed)
--  Td 2013 - had a flu shot  --CCS: 3 options discussed elected IFOB for now -- prostate ca screening : not indicated  -- diet and exercise:  Discussed --Labs:  CMP, FLP, CBC, A1c, TSH,

## 2019-11-01 ENCOUNTER — Other Ambulatory Visit: Payer: Self-pay

## 2019-11-01 ENCOUNTER — Other Ambulatory Visit: Payer: 59

## 2019-11-02 ENCOUNTER — Other Ambulatory Visit (INDEPENDENT_AMBULATORY_CARE_PROVIDER_SITE_OTHER): Payer: 59

## 2019-11-02 DIAGNOSIS — Z0000 Encounter for general adult medical examination without abnormal findings: Secondary | ICD-10-CM | POA: Diagnosis not present

## 2019-11-02 LAB — FECAL OCCULT BLOOD, IMMUNOCHEMICAL: Fecal Occult Bld: NEGATIVE

## 2019-11-09 ENCOUNTER — Other Ambulatory Visit: Payer: 59

## 2019-11-14 ENCOUNTER — Other Ambulatory Visit: Payer: Self-pay

## 2019-11-14 ENCOUNTER — Other Ambulatory Visit (INDEPENDENT_AMBULATORY_CARE_PROVIDER_SITE_OTHER): Payer: 59

## 2019-11-14 DIAGNOSIS — I10 Essential (primary) hypertension: Secondary | ICD-10-CM | POA: Diagnosis not present

## 2019-11-14 LAB — BASIC METABOLIC PANEL
BUN: 20 mg/dL (ref 6–23)
CO2: 29 mEq/L (ref 19–32)
Calcium: 9 mg/dL (ref 8.4–10.5)
Chloride: 105 mEq/L (ref 96–112)
Creatinine, Ser: 1 mg/dL (ref 0.40–1.50)
GFR: 79.09 mL/min (ref >60.00)
Glucose, Bld: 96 mg/dL (ref 70–99)
Potassium: 4.3 mEq/L (ref 3.5–5.1)
Sodium: 140 mEq/L (ref 135–145)

## 2019-12-30 ENCOUNTER — Other Ambulatory Visit: Payer: Self-pay | Admitting: Internal Medicine

## 2020-01-01 ENCOUNTER — Other Ambulatory Visit: Payer: Self-pay

## 2020-01-01 ENCOUNTER — Ambulatory Visit (INDEPENDENT_AMBULATORY_CARE_PROVIDER_SITE_OTHER): Payer: 59 | Admitting: Medical

## 2020-01-01 ENCOUNTER — Telehealth: Payer: Self-pay | Admitting: Medical

## 2020-01-01 ENCOUNTER — Encounter: Payer: Self-pay | Admitting: Medical

## 2020-01-01 ENCOUNTER — Ambulatory Visit (HOSPITAL_BASED_OUTPATIENT_CLINIC_OR_DEPARTMENT_OTHER)
Admission: RE | Admit: 2020-01-01 | Discharge: 2020-01-01 | Disposition: A | Discharge status: Home / Self Care | Payer: 59 | Source: Ambulatory Visit | Attending: Medical | Admitting: Medical

## 2020-01-01 VITALS — BP 120/85 | HR 82 | Temp 96.7°F | Resp 18 | Ht 70.0 in | Wt 204.0 lb

## 2020-01-01 DIAGNOSIS — M25522 Pain in left elbow: Secondary | ICD-10-CM

## 2020-01-01 DIAGNOSIS — S42402A Unspecified fracture of lower end of left humerus, initial encounter for closed fracture: Secondary | ICD-10-CM

## 2020-01-01 MED ORDER — TRAMADOL HCL 50 MG PO TABS: 50.0000 mg | ORAL_TABLET | Freq: Four times a day (QID) | ORAL | 0 refills | Status: AC | PRN

## 2020-01-01 NOTE — Progress Notes (Signed)
   Subjective:    Patient ID: Elijah Kelly, male    DOB: April 27, 1971, 49 y.o.   MRN: [id number]  HPI  Pt in for some pain in his rt elbow.   Pt states he was playing soccer and slipped and landed on bottom but tried to stop fall with his left arm. He speculates that he might have dislocated his elbow. He states diffuse bruising to his elbow. Medial elbow pain.   Pt states has taken motrin and tylenol. Pt had hard time sleeping due to the pain. With movement will start to have severe pain.   Review of Systems     Objective:   Physical Exam  General- No acute distress. Pleasant patient. Neck- Full range of motion, no jvd Lungs- Clear, even and unlabored. Heart- regular rate and rhythm. Neurologic- CNII- XII grossly intact.  Left elbow- moderate to severe swelling. With scattered bruising. Some swelling directly over the elbow.       Assessment & Plan:  You have injury that by exam causes concern for possible fracture. Will get xray today. See if you have fracture. Even with no fracture seeing Sports medicine MD to evaluate soft tissue may be beneficial.  Sent in tramadol to help with moderate to severe pain.   After xray might need arm sling.  Can use tylenol and ibuprofen for pain. Can use low dose combination very 8 hours. Use tramadol as a back up.  Follow up date to be determined after xray review.  Time spent with patient today was 20  minutes which consisted of chart rediew, discussing diagnosis, work up, treatment and documentation.  Esperanza Richters, PA-C

## 2020-01-01 NOTE — Patient Instructions (Addendum)
You have injury that by exam causes concern for possible fracture. Will get xray today. See if you have fracture. Even with no fracture seeing Sports medicine MD to evaluate soft tissue may be beneficial.  Sent in tramadol to help with moderate to severe pain.   After xray might need arm sling.  Can use tylenol and ibuprofen for pain. Can use low dose combination very 8 hours. Use tramadol as a back up.  Follow up date to be determined after xray review.

## 2020-01-01 NOTE — Telephone Encounter (Signed)
Refer to sports med 

## 2020-01-02 ENCOUNTER — Ambulatory Visit (INDEPENDENT_AMBULATORY_CARE_PROVIDER_SITE_OTHER): Payer: 59 | Admitting: Family Medicine

## 2020-01-02 ENCOUNTER — Ambulatory Visit: Payer: Self-pay

## 2020-01-02 ENCOUNTER — Encounter: Payer: Self-pay | Admitting: Family Medicine

## 2020-01-02 VITALS — BP 139/95 | HR 75 | Ht 69.0 in | Wt 197.0 lb

## 2020-01-02 DIAGNOSIS — M25522 Pain in left elbow: Secondary | ICD-10-CM

## 2020-01-02 DIAGNOSIS — S53102D Unspecified subluxation of left ulnohumeral joint, subsequent encounter: Secondary | ICD-10-CM | POA: Insufficient documentation

## 2020-01-02 NOTE — Patient Instructions (Signed)
Nice to meet you Please use the sling  Please use ice  Please try range of motion every hour or so  Please send me a message in MyChart with any questions or updates.  We will set up a virtual visit once the MRI is resulted.   --Dr. Jordan Likes

## 2020-01-02 NOTE — Assessment & Plan Note (Signed)
Injury occurred on 4/25.  Reports she may have had a possible subluxation.  Imaging revealed a possible fracture but has good range of motion.  Ultrasound suggests a rupture of the common flexors of the medial epicondyle. -Placed in sling. -Counseled on home exercise therapy and supportive care. -MRI to evaluate for tendon rupture

## 2020-01-02 NOTE — Progress Notes (Signed)
Elijah Kelly - 49 y.o. male MRN [id number]  Date of birth: 1971-03-23  SUBJECTIVE:  Including CC & ROS.  Chief Complaint  Patient presents with  . Elbow Injury    left elbow x 12/31/2019    Elijah Kelly is a 49 y.o. male that is left elbow pain.  He has significant bruising in the medial aspect of the upper arm and forearm.  He was playing soccer on Sunday and fell with a hyperextension of his elbow.  He feels like his range of motion is good.  Has significant swelling all throughout the elbow.  Independent review of the left elbow x-ray from 4/26 shows multiple lateral epicondyle and displacement of the fat pad anteriorly.   Review of Systems See HPI   HISTORY: Past Medical, Surgical, Social, and Family History Reviewed & Updated per EMR.   Pertinent Historical Findings include:  Past Medical History:  Diagnosis Date  . Chest pain 02-2009    normal  stress ECHO w/ incidental patent foramen ovale  . HTN (hypertension)   . Palpitation    saw cards 2015    Past Surgical History:  Procedure Laterality Date  . ADENOIDECTOMY      Family History  Problem Relation Age of Onset  . Hypertension Mother   . Diabetes Father        F late onset   . Cancer - Colon Neg Hx   . Cancer - Prostate Neg Hx   . CAD Neg Hx   . Stroke Neg Hx     Social History   Socioeconomic History  . Marital status: Married    Spouse name: Not on file  . Number of children: 2  . Years of education: Not on file  . Highest education level: Not on file  Occupational History  . Occupation: pharmaceuticals  Tobacco Use  . Smoking status: Former Research scientist (life sciences)  . Smokeless tobacco: Never Used  . Tobacco comment: quit 2009  Substance and Sexual Activity  . Alcohol use: Yes    Comment: occasional  . Drug use: No  . Sexual activity: Not on file  Other Topics Concern  . Not on file  Social History Narrative   Married, 2 children (2004, 2003), original from [place] Determinants of Health    Financial Resource Strain:   . Difficulty of Paying Living Expenses:   Food Insecurity:   . Worried About Charity fundraiser in the Last Year:   . Arboriculturist in the Last Year:   Transportation Needs:   . Film/video editor (Medical):   Marland Kitchen Lack of Transportation (Non-Medical):   Physical Activity:   . Days of Exercise per Week:   . Minutes of Exercise per Session:   Stress:   . Feeling of Stress :   Social Connections:   . Frequency of Communication with Friends and Family:   . Frequency of Social Gatherings with Friends and Family:   . Attends Religious Services:   . Active Member of Clubs or Organizations:   . Attends Archivist Meetings:   Marland Kitchen Marital Status:   Intimate Partner Violence:   . Fear of Current or Ex-Partner:   . Emotionally Abused:   Marland Kitchen Physically Abused:   . Sexually Abused:      PHYSICAL EXAM:  VS: BP (!) 139/95   Pulse 75   Ht 5\' 9"  (1.753 m)   Wt 197 lb (89.4 kg)   BMI  29.09 kg/m  Physical Exam Gen: NAD, alert, cooperative with exam, well-appearing MSK:  Left elbow:  Significant ecchymosis and swelling of the upper and lower arm  Near normal range of motion  Intact hook test of distal bicep No tenderness of the lateral epicondyle  Weakness at the wrist with flexion  Neurovascularly intact.   Limited ultrasound: left elbow:  Soft tissue changes to demonstrate swelling There appears to be detachment of the common flexors at the medial epicondyle. Appears rupture with retraction of roughly 2 cm.  No effusion of the joint seen posteriorly  Common extensors intact at the lateral epicondyle    Summary: findings suggest rupture of the common flexors at the medial epicondyle   Ultrasound and interpretation by Clare Gandy, MD   ASSESSMENT & PLAN:   Left elbow pain Injury occurred on 4/25.  Reports she may have had a possible subluxation.  Imaging revealed a possible fracture but has good range of motion.  Ultrasound  suggests a rupture of the common flexors of the medial epicondyle. -Placed in sling. -Counseled on home exercise therapy and supportive care. -MRI to evaluate for tendon rupture

## 2020-01-07 ENCOUNTER — Other Ambulatory Visit: Payer: Self-pay

## 2020-01-07 ENCOUNTER — Ambulatory Visit (INDEPENDENT_AMBULATORY_CARE_PROVIDER_SITE_OTHER): Payer: 59

## 2020-01-07 DIAGNOSIS — M25522 Pain in left elbow: Secondary | ICD-10-CM | POA: Diagnosis not present

## 2020-01-08 ENCOUNTER — Telehealth (INDEPENDENT_AMBULATORY_CARE_PROVIDER_SITE_OTHER): Payer: 59 | Admitting: Family Medicine

## 2020-01-08 DIAGNOSIS — S53102D Unspecified subluxation of left ulnohumeral joint, subsequent encounter: Secondary | ICD-10-CM | POA: Diagnosis not present

## 2020-01-08 NOTE — Assessment & Plan Note (Signed)
MRI confirming significant derangement of the joint itself. -Counseled on supportive care. -Referral to orthopedic surgery with Dr. Ave Filter.

## 2020-01-08 NOTE — Progress Notes (Signed)
Virtual Visit via Video Note  I connected with Elijah Kelly on 01/08/20 at  1:30 PM EDT by a video enabled telemedicine application and verified that I am speaking with the correct person using two identifiers.   I discussed the limitations of evaluation and management by telemedicine and the availability of in person appointments. The patient expressed understanding and agreed to proceed.  History of Present Illness:  Elijah Kelly is a 49 year old male that is following up after his MRI of his left elbow.  It is showing ligamentous rupture as well as tearing of the common flexor and extensor tendons.  Showing a joint effusion as well as bone contusions.  His ecchymosis has improved.  Having pain that is ongoing.   Observations/Objective:  Gen: NAD, alert, cooperative with exam, well-appearing     Assessment and Plan:  Elbow subluxation: MRI confirming significant derangement of the joint itself. -Counseled on supportive care. -Referral to orthopedic surgery with Dr. Ave Filter.  Follow Up Instructions:    I discussed the assessment and treatment plan with the patient. The patient was provided an opportunity to ask questions and all were answered. The patient agreed with the plan and demonstrated an understanding of the instructions.   The patient was advised to call back or seek an in-person evaluation if the symptoms worsen or if the condition fails to improve as anticipated.   Clare Gandy, MD

## 2020-01-09 ENCOUNTER — Telehealth: Payer: Self-pay | Admitting: Medical

## 2020-01-09 ENCOUNTER — Encounter: Payer: Self-pay | Admitting: Medical

## 2020-01-09 MED ORDER — HYDROCODONE-ACETAMINOPHEN 5-325 MG PO TABS: ORAL_TABLET | ORAL | 0 refills | Status: DC

## 2020-01-09 MED ORDER — MELOXICAM 7.5 MG PO TABS
ORAL_TABLET | ORAL | 0 refills | Status: DC
Start: 2020-01-09 — End: 2020-05-15

## 2020-01-09 NOTE — Telephone Encounter (Signed)
Rx meloxicam and norco sent to pt pharmacy.

## 2020-01-09 NOTE — Telephone Encounter (Signed)
Opened to review mri.

## 2020-01-15 ENCOUNTER — Encounter: Payer: Self-pay | Admitting: Family Medicine

## 2020-02-06 ENCOUNTER — Other Ambulatory Visit: Payer: Self-pay

## 2020-02-06 MED ORDER — LOSARTAN POTASSIUM 100 MG PO TABS: 100.0000 mg | ORAL_TABLET | Freq: Every day | ORAL | 3 refills | Status: DC

## 2020-04-29 ENCOUNTER — Telehealth: Payer: Self-pay | Admitting: Internal Medicine

## 2020-04-29 NOTE — Telephone Encounter (Signed)
Pt wants to know if during his appt on  05-15-2020 at 4:00 with Dr Drue Novel if pt could get shingles shot (pt has [facility] insurance) pt just turned 49 and wanted to know if he can get the shingles shot. Please advise. Pt tel [phone].

## 2020-04-29 NOTE — Telephone Encounter (Signed)
We can provide the Shingrix when he comes back. Advised patient to call his insurance to be sure is cover Also needs to be 2 weeks away from any of the Covid vaccines

## 2020-04-29 NOTE — Telephone Encounter (Signed)
Please advise 

## 2020-04-30 NOTE — Telephone Encounter (Signed)
Spoke w/ Pt- informed of recommendations. Pt verbalized understanding.  

## 2020-05-06 ENCOUNTER — Ambulatory Visit: Payer: 59 | Admitting: Internal Medicine

## 2020-05-15 ENCOUNTER — Other Ambulatory Visit: Payer: Self-pay

## 2020-05-15 ENCOUNTER — Encounter: Payer: Self-pay | Admitting: Internal Medicine

## 2020-05-15 ENCOUNTER — Ambulatory Visit (INDEPENDENT_AMBULATORY_CARE_PROVIDER_SITE_OTHER): Payer: 59 | Admitting: Internal Medicine

## 2020-05-15 VITALS — BP 135/94 | HR 52 | Temp 98.0°F | Resp 16 | Ht 69.0 in | Wt 209.1 lb

## 2020-05-15 DIAGNOSIS — I10 Essential (primary) hypertension: Secondary | ICD-10-CM | POA: Diagnosis not present

## 2020-05-15 DIAGNOSIS — Z23 Encounter for immunization: Secondary | ICD-10-CM | POA: Diagnosis not present

## 2020-05-15 NOTE — Progress Notes (Signed)
Pre visit review using our clinic review tool, if applicable. No additional management support is needed unless otherwise documented below in the visit note. 

## 2020-05-15 NOTE — Progress Notes (Signed)
   Subjective:    Patient ID: Elijah Kelly, male    DOB: 1971/04/21, 49 y.o.   MRN: [id number]  DOS:  05/15/2020 Type of visit - description: Routine checkup We talk about several issues today Hypertension Had a left elbow injury, eventually needed surgery, he feels almost back to normal. Request Shingrix vaccination.  BP Readings from Last 3 Encounters:  05/15/20 (!) 135/94  01/02/20 (!) 139/95  01/01/20 120/85     Review of Systems Admits to some stress. Has gained some weight lately due to not being able to exercise.  Past Medical History:  Diagnosis Date  . Chest pain 02-2009    normal  stress ECHO w/ incidental patent foramen ovale  . HTN (hypertension)   . Palpitation    saw cards 2015    Past Surgical History:  Procedure Laterality Date  . ADENOIDECTOMY      Allergies as of 05/15/2020   No Known Allergies     Medication List       Accurate as of May 15, 2020  4:30 PM. If you have any questions, ask your nurse or doctor.        aspirin 81 MG tablet Take 81 mg by mouth daily.   HYDROcodone-acetaminophen 5-325 MG tablet Commonly known as: Norco 1 tab po q 6 hours prn pain   losartan 100 MG tablet Commonly known as: COZAAR Take 1 tablet (100 mg total) by mouth daily.   meloxicam 7.5 MG tablet Commonly known as: MOBIC 1-2 tab po q day          Objective:   Physical Exam BP (!) 135/94 (BP Location: Right Arm, Patient Position: Sitting, Cuff Size: Normal)   Pulse (!) 52   Temp 98 F (36.7 C) (Oral)   Resp 16   Ht 5\' 9"  (1.753 m)   Wt 209 lb 2 oz (94.9 kg)   SpO2 99%   BMI 30.88 kg/m  General:   Well developed, NAD, BMI noted. HEENT:  Normocephalic . Face symmetric, atraumatic Lungs:  CTA B Normal respiratory effort, no intercostal retractions, no accessory muscle use. Heart: RRR,  no murmur.  Lower extremities: no pretibial edema bilaterally  Skin: Not pale. Not jaundice Neurologic:  alert & oriented X3.  Speech normal, gait  appropriate for age and unassisted Psych--  Cognition and judgment appear intact.  Cooperative with normal attention span and concentration.  Behavior appropriate. No anxious or depressed appearing.      Assessment      Assessment Hyperglycemia: A1c 6.2 (12-2018) HTN Chest pain, 2010, (-) stress echo w/ incidental patent foramen ovale Palpitations: saw Cardiology 2015.  Cardiac event monitor 2015 essentially negative Rash, sun related--on hydrocortisone  Prn, sun blockers   PLAN: HTN: After last visit, losartan was increased, follow-up BMP was okay, BP today is 135/94, at home BPs are in the 130s/85-95. Diastolic BP minimaly increased, for now we agreed to continue the same medications, watch salt intake and increase physical activity, reassess on RTC Left elbow injury: Recovering after surgery Preventive care:  Had Moderna C-19  vaccination x2 in April Shingrix No. 1 today, next in 2 months Recommend flu shot this year RTC 2 months for Shingrix No. 2 RTC CPX 10-2020  This visit occurred during the SARS-CoV-2 public health emergency.  Safety protocols were in place, including screening questions prior to the visit, additional usage of staff PPE, and extensive cleaning of exam room while observing appropriate contact time as indicated for disinfecting solutions.

## 2020-05-15 NOTE — Patient Instructions (Addendum)
Check the  blood pressure regularly  BP GOAL is between 110/65 and  135/85. If it is consistently higher or lower, let me know     GO TO THE FRONT DESK, PLEASE SCHEDULE YOUR APPOINTMENTS Come back in 2 months for a nurse visit (shingrix #2) Come back for a physical by 10/2020    DASH Eating Plan DASH stands for "Dietary Approaches to Stop Hypertension." The DASH eating plan is a healthy eating plan that has been shown to reduce high blood pressure (hypertension). It may also reduce your risk for type 2 diabetes, heart disease, and stroke. The DASH eating plan may also help with weight loss. What are tips for following this plan?  General guidelines  Avoid eating more than 2,300 mg (milligrams) of salt (sodium) a day. If you have hypertension, you may need to reduce your sodium intake to 1,500 mg a day.  Limit alcohol intake to no more than 1 drink a day for nonpregnant women and 2 drinks a day for men. One drink equals 12 oz of beer, 5 oz of wine, or 1 oz of hard liquor.  Work with your health care provider to maintain a healthy body weight or to lose weight. Ask what an ideal weight is for you.  Get at least 30 minutes of exercise that causes your heart to beat faster (aerobic exercise) most days of the week. Activities may include walking, swimming, or biking.  Work with your health care provider or diet and nutrition specialist (dietitian) to adjust your eating plan to your individual calorie needs. Reading food labels   Check food labels for the amount of sodium per serving. Choose foods with less than 5 percent of the Daily Value of sodium. Generally, foods with less than 300 mg of sodium per serving fit into this eating plan.  To find whole grains, look for the word "whole" as the first word in the ingredient list. Shopping  Buy products labeled as "low-sodium" or "no salt added."  Buy fresh foods. Avoid canned foods and premade or frozen meals. Cooking  Avoid adding  salt when cooking. Use salt-free seasonings or herbs instead of table salt or sea salt. Check with your health care provider or pharmacist before using salt substitutes.  Do not fry foods. Cook foods using healthy methods such as baking, boiling, grilling, and broiling instead.  Cook with heart-healthy oils, such as olive, canola, soybean, or sunflower oil. Meal planning  Eat a balanced diet that includes: ? 5 or more servings of fruits and vegetables each day. At each meal, try to fill half of your plate with fruits and vegetables. ? Up to 6-8 servings of whole grains each day. ? Less than 6 oz of lean meat, poultry, or fish each day. A 3-oz serving of meat is about the same size as a deck of cards. One egg equals 1 oz. ? 2 servings of low-fat dairy each day. ? A serving of nuts, seeds, or beans 5 times each week. ? Heart-healthy fats. Healthy fats called Omega-3 fatty acids are found in foods such as flaxseeds and coldwater fish, like sardines, salmon, and mackerel.  Limit how much you eat of the following: ? Canned or prepackaged foods. ? Food that is high in trans fat, such as fried foods. ? Food that is high in saturated fat, such as fatty meat. ? Sweets, desserts, sugary drinks, and other foods with added sugar. ? Full-fat dairy products.  Do not salt foods before eating.  Try  to eat at least 2 vegetarian meals each week.  Eat more home-cooked food and less restaurant, buffet, and fast food.  When eating at a restaurant, ask that your food be prepared with less salt or no salt, if possible. What foods are recommended? The items listed may not be a complete list. Talk with your dietitian about what dietary choices are best for you. Grains Whole-grain or whole-wheat bread. Whole-grain or whole-wheat pasta. Brown rice. Orpah Cobb. Bulgur. Whole-grain and low-sodium cereals. Pita bread. Low-fat, low-sodium crackers. Whole-wheat flour tortillas. Vegetables Fresh or frozen  vegetables (raw, steamed, roasted, or grilled). Low-sodium or reduced-sodium tomato and vegetable juice. Low-sodium or reduced-sodium tomato sauce and tomato paste. Low-sodium or reduced-sodium canned vegetables. Fruits All fresh, dried, or frozen fruit. Canned fruit in natural juice (without added sugar). Meat and other protein foods Skinless chicken or [place]. Ground chicken or [place]. Pork with fat trimmed off. Fish and seafood. Egg whites. Dried beans, peas, or lentils. Unsalted nuts, nut butters, and seeds. Unsalted canned beans. Lean cuts of beef with fat trimmed off. Low-sodium, lean deli meat. Dairy Low-fat (1%) or fat-free (skim) milk. Fat-free, low-fat, or reduced-fat cheeses. Nonfat, low-sodium ricotta or cottage cheese. Low-fat or nonfat yogurt. Low-fat, low-sodium cheese. Fats and oils Soft margarine without trans fats. Vegetable oil. Low-fat, reduced-fat, or light mayonnaise and salad dressings (reduced-sodium). Canola, safflower, olive, soybean, and sunflower oils. Avocado. Seasoning and other foods Herbs. Spices. Seasoning mixes without salt. Unsalted popcorn and pretzels. Fat-free sweets. What foods are not recommended? The items listed may not be a complete list. Talk with your dietitian about what dietary choices are best for you. Grains Baked goods made with fat, such as croissants, muffins, or some breads. Dry pasta or rice meal packs. Vegetables Creamed or fried vegetables. Vegetables in a cheese sauce. Regular canned vegetables (not low-sodium or reduced-sodium). Regular canned tomato sauce and paste (not low-sodium or reduced-sodium). Regular tomato and vegetable juice (not low-sodium or reduced-sodium). Rosita Fire. Olives. Fruits Canned fruit in a light or heavy syrup. Fried fruit. Fruit in cream or butter sauce. Meat and other protein foods Fatty cuts of meat. Ribs. Fried meat. Tomasa Blase. Sausage. Bologna and other processed lunch meats. Salami. Fatback. Hotdogs. Bratwurst.  Salted nuts and seeds. Canned beans with added salt. Canned or smoked fish. Whole eggs or egg yolks. Chicken or [place] with skin. Dairy Whole or 2% milk, cream, and half-and-half. Whole or full-fat cream cheese. Whole-fat or sweetened yogurt. Full-fat cheese. Nondairy creamers. Whipped toppings. Processed cheese and cheese spreads. Fats and oils Butter. Stick margarine. Lard. Shortening. Ghee. Bacon fat. Tropical oils, such as coconut, palm kernel, or palm oil. Seasoning and other foods Salted popcorn and pretzels. Onion salt, garlic salt, seasoned salt, table salt, and sea salt. Worcestershire sauce. Tartar sauce. Barbecue sauce. Teriyaki sauce. Soy sauce, including reduced-sodium. Steak sauce. Canned and packaged gravies. Fish sauce. Oyster sauce. Cocktail sauce. Horseradish that you find on the shelf. Ketchup. Mustard. Meat flavorings and tenderizers. Bouillon cubes. Hot sauce and Tabasco sauce. Premade or packaged marinades. Premade or packaged taco seasonings. Relishes. Regular salad dressings. Where to find more information:  National Heart, Lung, and Blood Institute: PopSteam.is  American Heart Association: www.heart.org Summary  The DASH eating plan is a healthy eating plan that has been shown to reduce high blood pressure (hypertension). It may also reduce your risk for type 2 diabetes, heart disease, and stroke.  With the DASH eating plan, you should limit salt (sodium) intake to 2,300 mg a day. If you  have hypertension, you may need to reduce your sodium intake to 1,500 mg a day.  When on the DASH eating plan, aim to eat more fresh fruits and vegetables, whole grains, lean proteins, low-fat dairy, and heart-healthy fats.  Work with your health care provider or diet and nutrition specialist (dietitian) to adjust your eating plan to your individual calorie needs. This information is not intended to replace advice given to you by your health care provider. Make sure you discuss any  questions you have with your health care provider. Document Revised: 08/06/2017 Document Reviewed: 08/17/2016 Elsevier Patient Education  2020 [employer].

## 2020-05-16 NOTE — Assessment & Plan Note (Signed)
HTN: After last visit, losartan was increased, follow-up BMP was okay, BP today is 135/94, at home BPs are in the 130s/85-95. Diastolic BP minimaly increased, for now we agreed to continue the same medications, watch salt intake and increase physical activity, reassess on RTC Left elbow injury: Recovering after surgery Preventive care:  Had Moderna C-19  vaccination x2 in April Shingrix No. 1 today, next in 2 months Recommend flu shot this year RTC 2 months for Shingrix No. 2 RTC CPX 10-2020

## 2020-07-16 ENCOUNTER — Ambulatory Visit: Payer: 59

## 2020-07-17 ENCOUNTER — Ambulatory Visit (INDEPENDENT_AMBULATORY_CARE_PROVIDER_SITE_OTHER): Payer: 59

## 2020-07-17 ENCOUNTER — Other Ambulatory Visit: Payer: Self-pay

## 2020-07-17 DIAGNOSIS — Z23 Encounter for immunization: Secondary | ICD-10-CM

## 2020-07-17 NOTE — Progress Notes (Addendum)
Pt is here today for 2nd shingrix vaccine. Pt was given given shingrix vaccine in left deltoid. Pt tolerated well   Willow Ora, MD

## 2020-08-22 ENCOUNTER — Encounter: Payer: Self-pay | Admitting: Internal Medicine

## 2020-08-22 MED ORDER — LOSARTAN POTASSIUM 100 MG PO TABS: 100.0000 mg | ORAL_TABLET | Freq: Every day | ORAL | 3 refills | Status: DC

## 2020-10-16 ENCOUNTER — Ambulatory Visit (INDEPENDENT_AMBULATORY_CARE_PROVIDER_SITE_OTHER): Payer: Managed Care, Other (non HMO) | Admitting: Internal Medicine

## 2020-10-16 ENCOUNTER — Other Ambulatory Visit: Payer: Self-pay

## 2020-10-16 ENCOUNTER — Encounter: Payer: Self-pay | Admitting: Internal Medicine

## 2020-10-16 VITALS — BP 145/102 | HR 56 | Temp 97.8°F | Resp 16 | Ht 69.0 in | Wt 201.5 lb

## 2020-10-16 DIAGNOSIS — I10 Essential (primary) hypertension: Secondary | ICD-10-CM | POA: Diagnosis not present

## 2020-10-16 DIAGNOSIS — Z1159 Encounter for screening for other viral diseases: Secondary | ICD-10-CM | POA: Diagnosis not present

## 2020-10-16 DIAGNOSIS — Z0000 Encounter for general adult medical examination without abnormal findings: Secondary | ICD-10-CM | POA: Diagnosis not present

## 2020-10-16 DIAGNOSIS — R739 Hyperglycemia, unspecified: Secondary | ICD-10-CM | POA: Diagnosis not present

## 2020-10-16 LAB — CBC WITH DIFFERENTIAL/PLATELET
Basophils Absolute: 0.1 10*3/uL (ref 0.0–0.1)
Basophils Relative: 1.1 % (ref 0.0–3.0)
Eosinophils Absolute: 0.2 10*3/uL (ref 0.0–0.7)
Eosinophils Relative: 3.9 % (ref 0.0–5.0)
HCT: 44.3 % (ref 39.0–52.0)
Hemoglobin: 14.7 g/dL (ref 13.0–17.0)
Lymphocytes Relative: 42.5 % (ref 12.0–46.0)
Lymphs Abs: 2 10*3/uL (ref 0.7–4.0)
MCHC: 33.1 g/dL (ref 30.0–36.0)
MCV: 89.2 fl (ref 78.0–100.0)
Monocytes Absolute: 0.3 10*3/uL (ref 0.1–1.0)
Monocytes Relative: 6.8 % (ref 3.0–12.0)
Neutro Abs: 2.2 10*3/uL (ref 1.4–7.7)
Neutrophils Relative %: 45.7 % (ref 43.0–77.0)
Platelets: 200 10*3/uL (ref 150.0–400.0)
RBC: 4.97 Mil/uL (ref 4.22–5.81)
RDW: 13.5 % (ref 11.5–15.5)
WBC: 4.7 10*3/uL (ref 4.0–10.5)

## 2020-10-16 LAB — COMPREHENSIVE METABOLIC PANEL
ALT: 16 U/L (ref 0–53)
AST: 9 U/L (ref 0–37)
Albumin: 4.5 g/dL (ref 3.5–5.2)
Alkaline Phosphatase: 60 U/L (ref 39–117)
BUN: 19 mg/dL (ref 6–23)
CO2: 28 mEq/L (ref 19–32)
Calcium: 9.4 mg/dL (ref 8.4–10.5)
Chloride: 105 mEq/L (ref 96–112)
Creatinine, Ser: 0.99 mg/dL (ref 0.40–1.50)
GFR: 88.57 mL/min (ref >60.00)
Glucose, Bld: 91 mg/dL (ref 70–99)
Potassium: 4.7 mEq/L (ref 3.5–5.1)
Sodium: 139 mEq/L (ref 135–145)
Total Bilirubin: 0.8 mg/dL (ref 0.2–1.2)
Total Protein: 7.4 g/dL (ref 6.0–8.3)

## 2020-10-16 LAB — HEMOGLOBIN A1C: Hgb A1c MFr Bld: 5.8 % (ref 4.6–6.5)

## 2020-10-16 LAB — LIPID PANEL
Cholesterol: 144 mg/dL (ref 0–200)
HDL: 44 mg/dL (ref >39.00)
LDL Cholesterol: 81 mg/dL (ref 0–99)
NonHDL: 100.03
Total CHOL/HDL Ratio: 3
Triglycerides: 95 mg/dL (ref 0.0–149.0)
VLDL: 19 mg/dL (ref 0.0–40.0)

## 2020-10-16 LAB — PSA: PSA: 0.33 ng/mL (ref 0.10–4.00)

## 2020-10-16 MED ORDER — AMLODIPINE BESYLATE 5 MG PO TABS: 5.0000 mg | ORAL_TABLET | Freq: Every day | ORAL | 3 refills | Status: DC

## 2020-10-16 MED ORDER — LOSARTAN POTASSIUM 100 MG PO TABS: 100.0000 mg | ORAL_TABLET | Freq: Every day | ORAL | 3 refills | Status: DC

## 2020-10-16 NOTE — Assessment & Plan Note (Signed)
--  Td 2013 - s/p shingrix x 2  - covid vax x 3 - had a flu shot  --CCS: 3 options discussed , previous iFOB neg, repeat iFOB -- prostate ca screening: DRE negative, checking a PSA, no symptoms -- diet and exercise: Discussed, specifically low-salt diet.  Advance directive discussed as well --Labs:CMP, FLP, CBC, A1c, PSA, hep C, I fob

## 2020-10-16 NOTE — Progress Notes (Signed)
   Subjective:    Patient ID: Elijah Kelly, male    DOB: [birth year], 49 y.o.   MRN: [id number]  DOS:  10/16/2020 Type of visit - description: cpx Feels well, no major concerns.    Review of Systems   A 14 point review of systems is negative    Past Medical History:  Diagnosis Date  . Chest pain 02-2009    normal  stress ECHO w/ incidental patent foramen ovale  . HTN (hypertension)   . Palpitation    saw cards 2015    Past Surgical History:  Procedure Laterality Date  . ADENOIDECTOMY    . ELBOW SURGERY Left 2021    Allergies as of 10/16/2020   No Known Allergies     Medication List       Accurate as of October 16, 2020  5:37 PM. If you have any questions, ask your nurse or doctor.        amLODipine 5 MG tablet Commonly known as: NORVASC Take 1 tablet (5 mg total) by mouth daily. Started by: Willow Ora, MD   aspirin 81 MG tablet Take 81 mg by mouth daily.   losartan 100 MG tablet Commonly known as: COZAAR Take 1 tablet (100 mg total) by mouth daily.          Objective:   Physical Exam BP (!) 145/102 (BP Location: Left Arm, Patient Position: Sitting, Cuff Size: Normal)   Pulse (!) 56   Temp 97.8 F (36.6 C) (Oral)   Resp 16   Ht 5\' 9"  (1.753 m)   Wt 201 lb 8 oz (91.4 kg)   SpO2 99%   BMI 29.76 kg/m  General: Well developed, NAD, BMI noted Neck: No  thyromegaly  HEENT:  Normocephalic . Face symmetric, atraumatic Lungs:  CTA B Normal respiratory effort, no intercostal retractions, no accessory muscle use. Heart: RRR,  no murmur.  Abdomen:  Not distended, soft, non-tender. No rebound or rigidity.   Lower extremities: no pretibial edema bilaterally DRE: Brown stools, normal sphincter tone, somewhat hard to reach gland but seems normal. Skin: Exposed areas without rash. Not pale. Not jaundice Neurologic:  alert & oriented X3.  Speech normal, gait appropriate for age and unassisted Strength symmetric and appropriate for age.  Psych: Cognition and  judgment appear intact.  Cooperative with normal attention span and concentration.  Behavior appropriate. No anxious or depressed appearing.     Assessment    Assessment Hyperglycemia: A1c 6.2 (12-2018) HTN Chest pain, 2010, (-) stress echo w/ incidental patent foramen ovale Palpitations: saw Cardiology 2015.  Cardiac event monitor 2015 essentially negative Rash, sun related--on hydrocortisone  Prn, sun blockers   PLAN: Here for CPX Hyperglycemia: Check A1c HTN: BP today is elevated, at home 125-135/85-95.  On losartan 100, rec  better control, goal to see diastolic BP in the 70s.  Add amlodipine 5 mg.  Watch salt diet, check BPs.  See AVS.  Return in 6 months RTC 6 months     This visit occurred during the SARS-CoV-2 public health emergency.  Safety protocols were in place, including screening questions prior to the visit, additional usage of staff PPE, and extensive cleaning of exam room while observing appropriate contact time as indicated for disinfecting solutions.

## 2020-10-16 NOTE — Progress Notes (Signed)
Pre visit review using our clinic review tool, if applicable. No additional management support is needed unless otherwise documented below in the visit note. 

## 2020-10-16 NOTE — Patient Instructions (Addendum)
Continue losartan. Add amlodipine 5 mg every night Check the  blood pressure weekly BP GOAL is between 110/65 and  130/80. If it is consistently higher or lower, let me know  GO TO THE LAB : Get the blood work     GO TO THE FRONT DESK, PLEASE SCHEDULE YOUR APPOINTMENTS Come back for   a checkup in 6 months       DASH Eating Plan DASH stands for Dietary Approaches to Stop Hypertension. The DASH eating plan is a healthy eating plan that has been shown to:  Reduce high blood pressure (hypertension).  Reduce your risk for type 2 diabetes, heart disease, and stroke.  Help with weight loss. What are tips for following this plan? Reading food labels  Check food labels for the amount of salt (sodium) per serving. Choose foods with less than 5 percent of the Daily Value of sodium. Generally, foods with less than 300 milligrams (mg) of sodium per serving fit into this eating plan.  To find whole grains, look for the word "whole" as the first word in the ingredient list. Shopping  Buy products labeled as "low-sodium" or "no salt added."  Buy fresh foods. Avoid canned foods and pre-made or frozen meals. Cooking  Avoid adding salt when cooking. Use salt-free seasonings or herbs instead of table salt or sea salt. Check with your health care provider or pharmacist before using salt substitutes.  Do not fry foods. Cook foods using healthy methods such as baking, boiling, grilling, roasting, and broiling instead.  Cook with heart-healthy oils, such as olive, canola, avocado, soybean, or sunflower oil. Meal planning  Eat a balanced diet that includes: ? 4 or more servings of fruits and 4 or more servings of vegetables each day. Try to fill one-half of your plate with fruits and vegetables. ? 6-8 servings of whole grains each day. ? Less than 6 oz (170 g) of lean meat, poultry, or fish each day. A 3-oz (85-g) serving of meat is about the same size as a deck of cards. One egg equals 1 oz  (28 g). ? 2-3 servings of low-fat dairy each day. One serving is 1 cup (237 mL). ? 1 serving of nuts, seeds, or beans 5 times each week. ? 2-3 servings of heart-healthy fats. Healthy fats called omega-3 fatty acids are found in foods such as walnuts, flaxseeds, fortified milks, and eggs. These fats are also found in cold-water fish, such as sardines, salmon, and mackerel.  Limit how much you eat of: ? Canned or prepackaged foods. ? Food that is high in trans fat, such as some fried foods. ? Food that is high in saturated fat, such as fatty meat. ? Desserts and other sweets, sugary drinks, and other foods with added sugar. ? Full-fat dairy products.  Do not salt foods before eating.  Do not eat more than 4 egg yolks a week.  Try to eat at least 2 vegetarian meals a week.  Eat more home-cooked food and less restaurant, buffet, and fast food.   Lifestyle  When eating at a restaurant, ask that your food be prepared with less salt or no salt, if possible.  If you drink alcohol: ? Limit how much you use to:  0-1 drink a day for women who are not pregnant.  0-2 drinks a day for men. ? Be aware of how much alcohol is in your drink. In the [place] one drink equals one 12 oz bottle of beer (355 mL), one 5 oz  glass of wine (148 mL), or one 1 oz glass of hard liquor (44 mL). General information  Avoid eating more than 2,300 mg of salt a day. If you have hypertension, you may need to reduce your sodium intake to 1,500 mg a day.  Work with your health care provider to maintain a healthy body weight or to lose weight. Ask what an ideal weight is for you.  Get at least 30 minutes of exercise that causes your heart to beat faster (aerobic exercise) most days of the week. Activities may include walking, swimming, or biking.  Work with your health care provider or dietitian to adjust your eating plan to your individual calorie needs. What foods should I eat? Fruits All fresh, dried, or frozen  fruit. Canned fruit in natural juice (without added sugar). Vegetables Fresh or frozen vegetables (raw, steamed, roasted, or grilled). Low-sodium or reduced-sodium tomato and vegetable juice. Low-sodium or reduced-sodium tomato sauce and tomato paste. Low-sodium or reduced-sodium canned vegetables. Grains Whole-grain or whole-wheat bread. Whole-grain or whole-wheat pasta. Brown rice. Orpah Cobb. Bulgur. Whole-grain and low-sodium cereals. Pita bread. Low-fat, low-sodium crackers. Whole-wheat flour tortillas. Meats and other proteins Skinless chicken or [place]. Ground chicken or [place]. Pork with fat trimmed off. Fish and seafood. Egg whites. Dried beans, peas, or lentils. Unsalted nuts, nut butters, and seeds. Unsalted canned beans. Lean cuts of beef with fat trimmed off. Low-sodium, lean precooked or cured meat, such as sausages or meat loaves. Dairy Low-fat (1%) or fat-free (skim) milk. Reduced-fat, low-fat, or fat-free cheeses. Nonfat, low-sodium ricotta or cottage cheese. Low-fat or nonfat yogurt. Low-fat, low-sodium cheese. Fats and oils Soft margarine without trans fats. Vegetable oil. Reduced-fat, low-fat, or light mayonnaise and salad dressings (reduced-sodium). Canola, safflower, olive, avocado, soybean, and sunflower oils. Avocado. Seasonings and condiments Herbs. Spices. Seasoning mixes without salt. Other foods Unsalted popcorn and pretzels. Fat-free sweets. The items listed above may not be a complete list of foods and beverages you can eat. Contact a dietitian for more information. What foods should I avoid? Fruits Canned fruit in a light or heavy syrup. Fried fruit. Fruit in cream or butter sauce. Vegetables Creamed or fried vegetables. Vegetables in a cheese sauce. Regular canned vegetables (not low-sodium or reduced-sodium). Regular canned tomato sauce and paste (not low-sodium or reduced-sodium). Regular tomato and vegetable juice (not low-sodium or reduced-sodium).  Rosita Fire. Olives. Grains Baked goods made with fat, such as croissants, muffins, or some breads. Dry pasta or rice meal packs. Meats and other proteins Fatty cuts of meat. Ribs. Fried meat. Tomasa Blase. Bologna, salami, and other precooked or cured meats, such as sausages or meat loaves. Fat from the back of a pig (fatback). Bratwurst. Salted nuts and seeds. Canned beans with added salt. Canned or smoked fish. Whole eggs or egg yolks. Chicken or [place] with skin. Dairy Whole or 2% milk, cream, and half-and-half. Whole or full-fat cream cheese. Whole-fat or sweetened yogurt. Full-fat cheese. Nondairy creamers. Whipped toppings. Processed cheese and cheese spreads. Fats and oils Butter. Stick margarine. Lard. Shortening. Ghee. Bacon fat. Tropical oils, such as coconut, palm kernel, or palm oil. Seasonings and condiments Onion salt, garlic salt, seasoned salt, table salt, and sea salt. Worcestershire sauce. Tartar sauce. Barbecue sauce. Teriyaki sauce. Soy sauce, including reduced-sodium. Steak sauce. Canned and packaged gravies. Fish sauce. Oyster sauce. Cocktail sauce. Store-bought horseradish. Ketchup. Mustard. Meat flavorings and tenderizers. Bouillon cubes. Hot sauces. Pre-made or packaged marinades. Pre-made or packaged taco seasonings. Relishes. Regular salad dressings. Other foods Salted popcorn and pretzels.  The items listed above may not be a complete list of foods and beverages you should avoid. Contact a dietitian for more information. Where to find more information  National Heart, Lung, and Blood Institute: PopSteam.is  American Heart Association: www.heart.org  Academy of Nutrition and Dietetics: www.eatright.org  National Kidney Foundation: www.kidney.org Summary  The DASH eating plan is a healthy eating plan that has been shown to reduce high blood pressure (hypertension). It may also reduce your risk for type 2 diabetes, heart disease, and stroke.  When on the DASH eating  plan, aim to eat more fresh fruits and vegetables, whole grains, lean proteins, low-fat dairy, and heart-healthy fats.  With the DASH eating plan, you should limit salt (sodium) intake to 2,300 mg a day. If you have hypertension, you may need to reduce your sodium intake to 1,500 mg a day.  Work with your health care provider or dietitian to adjust your eating plan to your individual calorie needs. This information is not intended to replace advice given to you by your health care provider. Make sure you discuss any questions you have with your health care provider. Document Revised: 07/28/2019 Document Reviewed: 07/28/2019 Elsevier Patient Education  2021 [employer].   Advance Directive  Advance directives are legal documents that allow you to make decisions about your health care and medical treatment in case you become unable to communicate for yourself. Advance directives let your wishes be known to family, friends, and health care providers. Discussing and writing advance directives should happen over time rather than all at once. Advance directives can be changed and updated at any time. There are different types of advance directives, such as:  Medical power of attorney.  Living will.  Do not resuscitate (DNR) order or do not attempt resuscitation (DNAR) order. Health care proxy and medical power of attorney A health care proxy is also called a health care agent. This person is appointed to make medical decisions for you when you are unable to make decisions for yourself. Generally, people ask a trusted friend or family member to act as their proxy and represent their preferences. Make sure you have an agreement with your trusted person to act as your proxy. A proxy may have to make a medical decision on your behalf if your wishes are not known. A medical power of attorney, also called a durable power of attorney for health care, is a legal document that names your health care proxy.  Depending on the laws in your state, the document may need to be:  Signed.  Notarized.  Dated.  Copied.  Witnessed.  Incorporated into your medical record. You may also want to appoint a trusted person to manage your money in the event you are unable to do so. This is called a durable power of attorney for finances. It is a separate legal document from the durable power of attorney for health care. You may choose your health care proxy or someone different to act as your agent in money matters. If you do not appoint a proxy, or there is a concern that the proxy is not acting in your best interest, a court may appoint a guardian to act on your behalf. Living will A living will is a set of instructions that state your wishes about medical care when you cannot express them yourself. Health care providers should keep a copy of your living will in your medical record. You may want to give a copy to family members or friends.  To alert caregivers in case of an emergency, you can place a card in your wallet to let them know that you have a living will and where they can find it. A living will is used if you become:  Terminally ill.  Disabled.  Unable to communicate or make decisions. The following decisions should be included in your living will:  To use or not to use life support equipment, such as dialysis machines and breathing machines (ventilators).  Whether you want a DNR or DNAR order. This tells health care providers not to use cardiopulmonary resuscitation (CPR) if breathing or heartbeat stops.  To use or not to use tube feeding.  To be given or not to be given food and fluids.  Whether you want comfort (palliative) care when the goal becomes comfort rather than a cure.  Whether you want to donate your organs and tissues. A living will does not give instructions for distributing your money and property if you should pass away. DNR or DNAR A DNR or DNAR order is a request not to  have CPR in the event that your heart stops beating or you stop breathing. If a DNR or DNAR order has not been made and shared, a health care provider will try to help any patient whose heart has stopped or who has stopped breathing. If you plan to have surgery, talk with your health care provider about how your DNR or DNAR order will be followed if problems occur. What if I do not have an advance directive? Some states assign family decision makers to act on your behalf if you do not have an advance directive. Each state has its own laws about advance directives. You may want to check with your health care provider, attorney, or state representative about the laws in your state. Summary  Advance directives are legal documents that allow you to make decisions about your health care and medical treatment in case you become unable to communicate for yourself.  The process of discussing and writing advance directives should happen over time. You can change and update advance directives at any time.  Advance directives may include a medical power of attorney, a living will, and a DNR or DNAR order. This information is not intended to replace advice given to you by your health care provider. Make sure you discuss any questions you have with your health care provider. Document Revised: 05/28/2020 Document Reviewed: 05/28/2020 Elsevier Patient Education  2021 [employer].

## 2020-10-16 NOTE — Assessment & Plan Note (Signed)
Here for CPX Hyperglycemia: Check A1c HTN: BP today is elevated, at home 125-135/85-95.  On losartan 100, rec  better control, goal to see diastolic BP in the 70s.  Add amlodipine 5 mg.  Watch salt diet, check BPs.  See AVS.  Return in 6 months RTC 6 months

## 2020-10-17 LAB — HEPATITIS C ANTIBODY
Hepatitis C Ab: NONREACTIVE
SIGNAL TO CUT-OFF: 0.01 (ref <1.00)

## 2020-10-22 ENCOUNTER — Other Ambulatory Visit (INDEPENDENT_AMBULATORY_CARE_PROVIDER_SITE_OTHER): Payer: Managed Care, Other (non HMO)

## 2020-10-22 DIAGNOSIS — Z0000 Encounter for general adult medical examination without abnormal findings: Secondary | ICD-10-CM | POA: Diagnosis not present

## 2020-10-22 LAB — FECAL OCCULT BLOOD, IMMUNOCHEMICAL: Fecal Occult Bld: NEGATIVE

## 2021-04-21 ENCOUNTER — Ambulatory Visit: Payer: Managed Care, Other (non HMO) | Admitting: Internal Medicine

## 2021-04-22 ENCOUNTER — Encounter: Payer: Self-pay | Admitting: Internal Medicine

## 2021-04-22 ENCOUNTER — Other Ambulatory Visit: Payer: Self-pay

## 2021-04-22 ENCOUNTER — Ambulatory Visit (INDEPENDENT_AMBULATORY_CARE_PROVIDER_SITE_OTHER): Payer: Managed Care, Other (non HMO) | Admitting: Internal Medicine

## 2021-04-22 VITALS — BP 122/66 | HR 66 | Temp 98.4°F | Resp 16 | Ht 69.0 in | Wt 203.4 lb

## 2021-04-22 DIAGNOSIS — I10 Essential (primary) hypertension: Secondary | ICD-10-CM | POA: Diagnosis not present

## 2021-04-22 DIAGNOSIS — R739 Hyperglycemia, unspecified: Secondary | ICD-10-CM | POA: Diagnosis not present

## 2021-04-22 LAB — BASIC METABOLIC PANEL
BUN: 17 mg/dL (ref 6–23)
CO2: 27 mEq/L (ref 19–32)
Calcium: 9.3 mg/dL (ref 8.4–10.5)
Chloride: 104 mEq/L (ref 96–112)
Creatinine, Ser: 1 mg/dL (ref 0.40–1.50)
GFR: 87.19 mL/min (ref >60.00)
Glucose, Bld: 83 mg/dL (ref 70–99)
Potassium: 4.6 mEq/L (ref 3.5–5.1)
Sodium: 138 mEq/L (ref 135–145)

## 2021-04-22 LAB — HEMOGLOBIN A1C: Hgb A1c MFr Bld: 6 % (ref 4.6–6.5)

## 2021-04-22 MED ORDER — LOSARTAN POTASSIUM 100 MG PO TABS: 100.0000 mg | ORAL_TABLET | Freq: Every day | ORAL | 1 refills | Status: DC

## 2021-04-22 MED ORDER — AMLODIPINE BESYLATE 5 MG PO TABS: 5.0000 mg | ORAL_TABLET | Freq: Every day | ORAL | 1 refills | Status: DC

## 2021-04-22 NOTE — Assessment & Plan Note (Signed)
Hyperglycemia: In the last few months he has been very busy at work not been able to exercise or eat healthy.  Counseled. HTN: On losartan, amlodipine added 23months ago, ambulatory BPs are very good with only 3 of 4 exceptions in the last few months.  BP today is excellent.  Plan: RF meds, BMP. Preventive care: Rec   COVID-vaccine #4 and a  flu shot. RTC CPX 6 months

## 2021-04-22 NOTE — Progress Notes (Signed)
   Subjective:    Patient ID: Elijah Kelly, male    DOB: [birth year], 50 y.o.   MRN: [id number]  DOS:  04/22/2021 Type of visit - description: Follow-up Today with talk about high blood pressure, vaccinations, hyperglycemia. Since the last visit, amlodipine is started, good compliance, no apparent side effects.  Review of Systems Denies chest pain or difficulty breathing. No lower extremity  Past Medical History:  Diagnosis Date   Chest pain 02-2009    normal  stress ECHO w/ incidental patent foramen ovale   HTN (hypertension)    Palpitation    saw cards 2015    Past Surgical History:  Procedure Laterality Date   ADENOIDECTOMY     ELBOW SURGERY Left 2021    Allergies as of 04/22/2021   No Known Allergies      Medication List        Accurate as of April 22, 2021 11:06 AM. If you have any questions, ask your nurse or doctor.          amLODipine 5 MG tablet Commonly known as: NORVASC Take 1 tablet (5 mg total) by mouth daily.   aspirin 81 MG tablet Take 81 mg by mouth daily.   losartan 100 MG tablet Commonly known as: COZAAR Take 1 tablet (100 mg total) by mouth daily.           Objective:   Physical Exam BP 122/66 (BP Location: Left Arm, Patient Position: Sitting, Cuff Size: Small)   Pulse 66   Temp 98.4 F (36.9 C) (Oral)   Resp 16   Ht 5\' 9"  (1.753 m)   Wt 203 lb 6 oz (92.3 kg)   SpO2 97%   BMI 30.03 kg/m  General:   Well developed, NAD, BMI noted. HEENT:  Normocephalic . Face symmetric, atraumatic Lungs:  CTA B Normal respiratory effort, no intercostal retractions, no accessory muscle use. Heart: RRR,  no murmur.  Lower extremities: no pretibial edema bilaterally  Skin: Not pale. Not jaundice Neurologic:  alert & oriented X3.  Speech normal, gait appropriate for age and unassisted Psych--  Cognition and judgment appear intact.  Cooperative with normal attention span and concentration.  Behavior appropriate. No anxious or depressed  appearing.      Assessment     Assessment Hyperglycemia: A1c 6.2 (12-2018) HTN Chest pain, 2010, (-) stress echo w/ incidental patent foramen ovale Palpitations: saw Cardiology 2015.  Cardiac event monitor 2015 essentially negative Rash, sun related--on hydrocortisone  Prn, sun blockers   PLAN: Hyperglycemia: In the last few months he has been very busy at work not been able to exercise or eat healthy.  Counseled. HTN: On losartan, amlodipine added 50months ago, ambulatory BPs are very good with only 3 of 4 exceptions in the last few months.  BP today is excellent.  Plan: RF meds, BMP. Preventive care: Rec   COVID-vaccine #4 and a  flu shot. RTC CPX 6 months  This visit occurred during the SARS-CoV-2 public health emergency.  Safety protocols were in place, including screening questions prior to the visit, additional usage of staff PPE, and extensive cleaning of exam room while observing appropriate contact time as indicated for disinfecting solutions.

## 2021-04-22 NOTE — Patient Instructions (Addendum)
Recommend to proceed with covid vaccine #4- you may do this downstairs at our pharmacy if you like.   Recommend a flu shot this fall  Check the  blood pressure regularly  BP GOAL is between 110/65 and  135/85. If it is consistently higher or lower, let me know     GO TO THE LAB : Get the blood work     GO TO THE FRONT DESK, PLEASE SCHEDULE YOUR APPOINTMENTS Come back for a physical exam by 10-2021

## 2021-08-28 ENCOUNTER — Ambulatory Visit: Payer: Managed Care, Other (non HMO) | Attending: Internal Medicine

## 2021-08-28 DIAGNOSIS — Z23 Encounter for immunization: Secondary | ICD-10-CM

## 2021-08-28 NOTE — Progress Notes (Signed)
° °  Covid-19 Vaccination Clinic  Name:  Elijah Kelly    MRN: [id number] DOB: 1971-07-09  08/28/2021  [name] was observed post Covid-19 immunization for 15 minutes without incident. He was provided with Vaccine Information Sheet and instruction to access the V-Safe system.   [name] was instructed to call 911 with any severe reactions post vaccine: Difficulty breathing  Swelling of face and throat  A fast heartbeat  A bad rash all over body  Dizziness and weakness   Immunizations Administered     Name Date Dose VIS Date Route   Moderna Covid-19 vaccine Bivalent Booster 08/28/2021 12:43 PM 0.5 mL 04/19/2021 Intramuscular   Manufacturer: Gala Murdoch   Lot: [id number]   NDC: 25053-976-73

## 2021-08-29 ENCOUNTER — Other Ambulatory Visit (HOSPITAL_BASED_OUTPATIENT_CLINIC_OR_DEPARTMENT_OTHER): Payer: Self-pay

## 2021-08-29 MED ORDER — MODERNA COVID-19 BIVAL BOOSTER 50 MCG/0.5ML IM SUSP
INTRAMUSCULAR | 0 refills | Status: DC
  Filled 2021-08-29: qty 0.5, 1d supply, fill #0

## 2021-10-28 ENCOUNTER — Ambulatory Visit (INDEPENDENT_AMBULATORY_CARE_PROVIDER_SITE_OTHER): Payer: 59 | Admitting: Internal Medicine

## 2021-10-28 ENCOUNTER — Encounter: Payer: Self-pay | Admitting: Internal Medicine

## 2021-10-28 VITALS — BP 132/84 | HR 73 | Temp 98.2°F | Resp 16 | Ht 69.0 in | Wt 203.1 lb

## 2021-10-28 DIAGNOSIS — Z0000 Encounter for general adult medical examination without abnormal findings: Secondary | ICD-10-CM | POA: Diagnosis not present

## 2021-10-28 DIAGNOSIS — I10 Essential (primary) hypertension: Secondary | ICD-10-CM | POA: Diagnosis not present

## 2021-10-28 DIAGNOSIS — R739 Hyperglycemia, unspecified: Secondary | ICD-10-CM

## 2021-10-28 DIAGNOSIS — Z8249 Family history of ischemic heart disease and other diseases of the circulatory system: Secondary | ICD-10-CM

## 2021-10-28 LAB — COMPREHENSIVE METABOLIC PANEL
ALT: 15 U/L (ref 0–53)
AST: 10 U/L (ref 0–37)
Albumin: 4.6 g/dL (ref 3.5–5.2)
Alkaline Phosphatase: 57 U/L (ref 39–117)
BUN: 15 mg/dL (ref 6–23)
CO2: 31 mEq/L (ref 19–32)
Calcium: 9.4 mg/dL (ref 8.4–10.5)
Chloride: 101 mEq/L (ref 96–112)
Creatinine, Ser: 0.98 mg/dL (ref 0.40–1.50)
GFR: 89 mL/min (ref >60.00)
Glucose, Bld: 89 mg/dL (ref 70–99)
Potassium: 4.6 mEq/L (ref 3.5–5.1)
Sodium: 137 mEq/L (ref 135–145)
Total Bilirubin: 1 mg/dL (ref 0.2–1.2)
Total Protein: 7.6 g/dL (ref 6.0–8.3)

## 2021-10-28 LAB — CBC WITH DIFFERENTIAL/PLATELET
Basophils Absolute: 0 10*3/uL (ref 0.0–0.1)
Basophils Relative: 0.6 % (ref 0.0–3.0)
Eosinophils Absolute: 0.2 10*3/uL (ref 0.0–0.7)
Eosinophils Relative: 3.5 % (ref 0.0–5.0)
HCT: 41.2 % (ref 39.0–52.0)
Hemoglobin: 13.8 g/dL (ref 13.0–17.0)
Lymphocytes Relative: 44.1 % (ref 12.0–46.0)
Lymphs Abs: 2.4 10*3/uL (ref 0.7–4.0)
MCHC: 33.4 g/dL (ref 30.0–36.0)
MCV: 88.1 fl (ref 78.0–100.0)
Monocytes Absolute: 0.4 10*3/uL (ref 0.1–1.0)
Monocytes Relative: 7.1 % (ref 3.0–12.0)
Neutro Abs: 2.5 10*3/uL (ref 1.4–7.7)
Neutrophils Relative %: 44.7 % (ref 43.0–77.0)
Platelets: 235 10*3/uL (ref 150.0–400.0)
RBC: 4.68 Mil/uL (ref 4.22–5.81)
RDW: 13.2 % (ref 11.5–15.5)
WBC: 5.5 10*3/uL (ref 4.0–10.5)

## 2021-10-28 LAB — LIPID PANEL
Cholesterol: 157 mg/dL (ref 0–200)
HDL: 42.9 mg/dL (ref >39.00)
LDL Cholesterol: 96 mg/dL (ref 0–99)
NonHDL: 113.68
Total CHOL/HDL Ratio: 4
Triglycerides: 87 mg/dL (ref 0.0–149.0)
VLDL: 17.4 mg/dL (ref 0.0–40.0)

## 2021-10-28 LAB — HEMOGLOBIN A1C: Hgb A1c MFr Bld: 5.9 % (ref 4.6–6.5)

## 2021-10-28 LAB — PSA: PSA: 0.39 ng/mL (ref 0.10–4.00)

## 2021-10-28 NOTE — Patient Instructions (Signed)
Please read the information below regarding living will  Check the  blood pressure regularly BP GOAL is between 110/65 and  135/85. If it is consistently higher or lower, let me know  We will schedule a calcium coronary score  GO TO THE LAB : Get the blood work     [place], [place] back for   a physical exam in 1 year.  Sooner if needed     "Living will", "[place] of attorney": Advanced care planning  (If you already have a living will or healthcare power of attorney, please bring the copy to be scanned in your chart.)  Advance care planning is a process that supports adults in  understanding and sharing their preferences regarding future medical care.   The patient's preferences are recorded in documents called Advance Directives.    Advanced directives are completed (and can be modified at any time) while the patient is in full mental capacity.   The documentation should be available at all times to the patient, the family and the healthcare providers.  Bring in a copy to be scanned in your chart is an excellent idea and is recommended   This legal documents direct treatment decision making and/or appoint a surrogate to make the decision if the patient is not capable to do so.    Advance directives can be documented in many types of formats,  documents have names such as:  Lliving will  Durable power of attorney for healthcare (healthcare proxy or healthcare power of attorney)  Combined directives  Physician orders for life-sustaining treatment    More information at:  [email or web address]

## 2021-10-28 NOTE — Progress Notes (Signed)
Subjective:    Patient ID: Elijah Kelly, male    DOB: 1971-02-01, 51 y.o.   MRN: [id number]  DOS:  10/28/2021 Type of visit - description: CPX  Since the last office visit is doing well other than mild back pain on and off Was diagnosed with COVID in January, mild case, he feels recuperated    Review of Systems  Other than above, a 14 point review of systems is negative      Past Medical History:  Diagnosis Date   Chest pain 02-2009    normal  stress ECHO w/ incidental patent foramen ovale   HTN (hypertension)    Palpitation    saw cards 2015    Past Surgical History:  Procedure Laterality Date   ADENOIDECTOMY     ELBOW SURGERY Left 2021   Social History   Socioeconomic History   Marital status: Married    Spouse name: Not on file   Number of children: 2   Years of education: Not on file   Highest education level: Not on file  Occupational History   Occupation: pharmaceuticals  Tobacco Use   Smoking status: Former   Smokeless tobacco: Never   Tobacco comments:    ~ 1989- 2009, < 1/2 ppd   Substance and Sexual Activity   Alcohol use: Yes    Comment: occasional   Drug use: No   Sexual activity: Not on file  Other Topics Concern   Not on file  Social History Narrative   Married, 2 children (2004, 2003), original from [place]   Daughter @ college     Son HS         Social Determinants of Health   Financial Resource Strain: Not on file  Food Insecurity: Not on file  Transportation Needs: Not on file  Physical Activity: Not on file  Stress: Not on file  Social Connections: Not on file  Intimate Partner Violence: Not on file    Current Outpatient Medications  Medication Instructions   amLODipine (NORVASC) 5 mg, Oral, Daily   aspirin 81 mg, Daily   baclofen (LIORESAL) 20 mg, Oral, 3 times daily PRN   losartan (COZAAR) 100 mg, Oral, Daily       Objective:   Physical Exam BP 132/84 (BP Location: Left Arm, Patient Position: Sitting, Cuff Size:  Small)    Pulse 73    Temp 98.2 F (36.8 C) (Oral)    Resp 16    Ht 5\' 9"  (1.753 m)    Wt 203 lb 2 oz (92.1 kg)    SpO2 97%    BMI 30.00 kg/m  General: Well developed, NAD, BMI noted Neck: No  thyromegaly  HEENT:  Normocephalic . Face symmetric, atraumatic Lungs:  CTA B Normal respiratory effort, no intercostal retractions, no accessory muscle use. Heart: RRR,  no murmur.  Abdomen:  Not distended, soft, non-tender. No rebound or rigidity.   Lower extremities: no pretibial edema bilaterally  Skin: Exposed areas without rash. Not pale. Not jaundice Neurologic:  alert & oriented X3.  Speech normal, gait appropriate for age and unassisted Strength symmetric and appropriate for age.  Psych: Cognition and judgment appear intact.  Cooperative with normal attention span and concentration.  Behavior appropriate. No anxious or depressed appearing.     Assessment    Assessment Hyperglycemia: A1c 6.2 (12-2018) HTN Chest pain, 2010, (-) stress echo w/ incidental patent foramen ovale Palpitations: saw Cardiology 2015.  Cardiac event monitor 2015 essentially negative Rash, sun related--on hydrocortisone  Prn, sun blockers   PLAN: Here for CPX Hyperglycemia: Check A1c, diet and exercise discussed HTN: Good compliance with amlodipine, losartan, BP today satisfactory, recommend to check ambulatory BPs. Cardiovascular risk: His calculated CV RF is 3.5%, he has hyperglycemia, he is somewhat concerned about it, we took about the pros and cons of a calcium coronary score and he likes to proceed. RTC 1 year    This visit occurred during the SARS-CoV-2 public health emergency.  Safety protocols were in place, including screening questions prior to the visit, additional usage of staff PPE, and extensive cleaning of exam room while observing appropriate contact time as indicated for disinfecting solutions.

## 2021-10-30 ENCOUNTER — Encounter: Payer: Self-pay | Admitting: Internal Medicine

## 2021-10-30 NOTE — Assessment & Plan Note (Signed)
--  Td 2017 - s/p shingrix x 2  - covid vax utd - had a flu shot  --CCS: 3 options discussed , previous iFOBs neg, repeat iFOB -- prostate ca screening: DRE-PSA normal last year, likes PSA recheck.  Will do -- diet and exercise: Discussed --Labs: CMP, FLP, CBC, A1c, PSA, I fob -ACP info provided

## 2021-10-30 NOTE — Assessment & Plan Note (Signed)
Here for CPX Hyperglycemia: Check A1c, diet and exercise discussed HTN: Good compliance with amlodipine, losartan, BP today satisfactory, recommend to check ambulatory BPs. Cardiovascular risk: His calculated CV RF is 3.5%, he has hyperglycemia, he is somewhat concerned about it, we took about the pros and cons of a calcium coronary score and he likes to proceed. RTC 1 year

## 2021-11-05 ENCOUNTER — Other Ambulatory Visit (INDEPENDENT_AMBULATORY_CARE_PROVIDER_SITE_OTHER): Payer: 59

## 2021-11-05 DIAGNOSIS — Z0000 Encounter for general adult medical examination without abnormal findings: Secondary | ICD-10-CM | POA: Diagnosis not present

## 2021-11-05 LAB — FECAL OCCULT BLOOD, IMMUNOCHEMICAL: Fecal Occult Bld: NEGATIVE

## 2021-11-07 ENCOUNTER — Other Ambulatory Visit: Payer: Self-pay

## 2021-11-07 ENCOUNTER — Ambulatory Visit (HOSPITAL_BASED_OUTPATIENT_CLINIC_OR_DEPARTMENT_OTHER)
Admission: RE | Admit: 2021-11-07 | Discharge: 2021-11-07 | Disposition: A | Discharge status: Home / Self Care | Payer: 59 | Source: Ambulatory Visit | Attending: Internal Medicine | Admitting: Internal Medicine

## 2021-11-07 DIAGNOSIS — Z8249 Family history of ischemic heart disease and other diseases of the circulatory system: Secondary | ICD-10-CM | POA: Insufficient documentation

## 2021-11-11 ENCOUNTER — Encounter: Payer: Self-pay | Admitting: Internal Medicine

## 2021-11-11 MED ORDER — ATORVASTATIN CALCIUM 10 MG PO TABS: 10.0000 mg | ORAL_TABLET | Freq: Every day | ORAL | 0 refills | Status: DC

## 2021-11-11 NOTE — Addendum Note (Signed)
Addended byConrad Noble D on: 11/11/2021 09:55 AM   Modules accepted: Orders

## 2021-11-14 ENCOUNTER — Other Ambulatory Visit: Payer: Self-pay | Admitting: Internal Medicine

## 2021-11-14 MED ORDER — ATORVASTATIN CALCIUM 10 MG PO TABS: 10.0000 mg | ORAL_TABLET | Freq: Every day | ORAL | 0 refills | Status: DC

## 2021-11-21 ENCOUNTER — Other Ambulatory Visit: Payer: 59

## 2021-12-22 ENCOUNTER — Other Ambulatory Visit (INDEPENDENT_AMBULATORY_CARE_PROVIDER_SITE_OTHER): Payer: 59

## 2021-12-22 ENCOUNTER — Other Ambulatory Visit: Payer: Self-pay | Admitting: Internal Medicine

## 2021-12-22 DIAGNOSIS — Z8249 Family history of ischemic heart disease and other diseases of the circulatory system: Secondary | ICD-10-CM

## 2021-12-22 LAB — ALT: ALT: 23 U/L (ref 0–53)

## 2021-12-22 LAB — LIPID PANEL
Cholesterol: 129 mg/dL (ref 0–200)
HDL: 42.7 mg/dL (ref >39.00)
LDL Cholesterol: 70 mg/dL (ref 0–99)
NonHDL: 86.03
Total CHOL/HDL Ratio: 3
Triglycerides: 79 mg/dL (ref 0.0–149.0)
VLDL: 15.8 mg/dL (ref 0.0–40.0)

## 2021-12-22 LAB — AST: AST: 12 U/L (ref 0–37)

## 2021-12-24 MED ORDER — ATORVASTATIN CALCIUM 10 MG PO TABS: 10.0000 mg | ORAL_TABLET | Freq: Every day | ORAL | 3 refills | Status: DC

## 2021-12-24 NOTE — Addendum Note (Signed)
Addended byConrad [place] D on: 12/24/2021 02:17 PM ? ? Modules accepted: Orders ? ?

## 2021-12-25 ENCOUNTER — Other Ambulatory Visit: Payer: Self-pay | Admitting: Internal Medicine

## 2022-01-28 ENCOUNTER — Encounter: Payer: Self-pay | Admitting: Internal Medicine

## 2022-08-14 IMAGING — CT CT CARDIAC CORONARY ARTERY CALCIUM SCORE
2 series · 15 of 20 positions shown, 17 images · non-contrast
Comparison: None.
COMPARISON: None.

Addendum:
EXAM:
OVER-READ INTERPRETATION  CT CHEST

The following report is an over-read performed by radiologist Dr.
over-read does not include interpretation of cardiac or coronary
anatomy or pathology. The coronary calcium score interpretation by
the cardiologist is attached.
CLINICAL DATA: 50M for cardiovascular disease risk stratification
Coronary Calcium Score
TECHNIQUE: A gated, non-contrast computed tomography scan of the heart was
performed using 3mm slice thickness. Axial images were analyzed on a
dedicated workstation. Calcium scoring of the coronary arteries was
performed using the Agatston method.

[Series 2: cascseq 3.0 b35f 70% · axial · 0.40mm/px · z∈[-242,-152]mm · 7 of 45 slices shown]
[im 5/45  vessel]
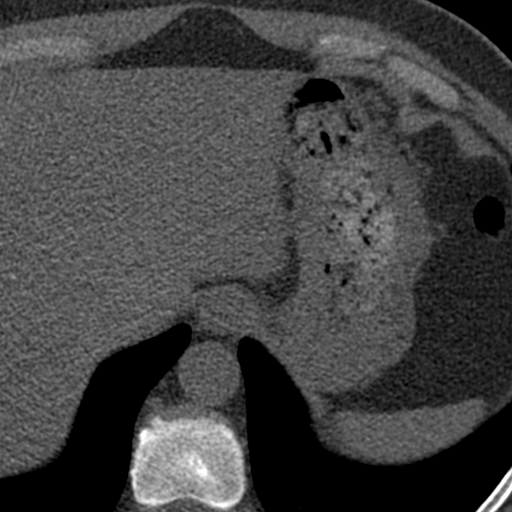
[im 10/45  vessel]
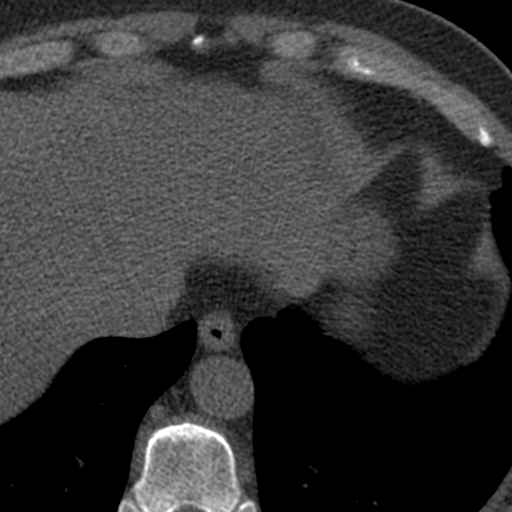
[im 15/45  vessel]
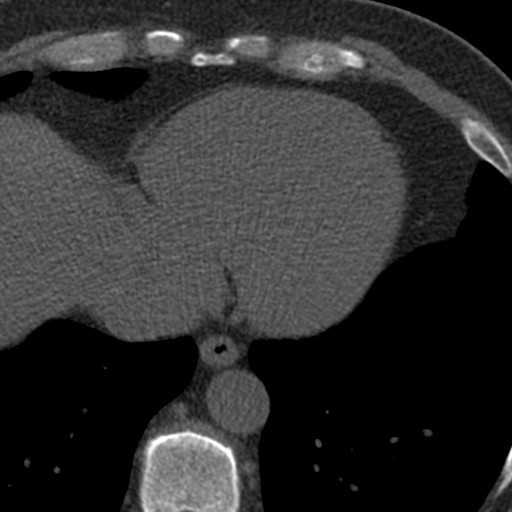
[im 20/45  vessel]
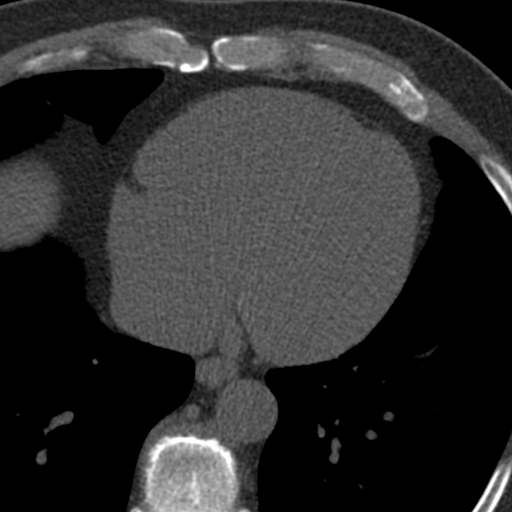
[im 25/45  vessel]
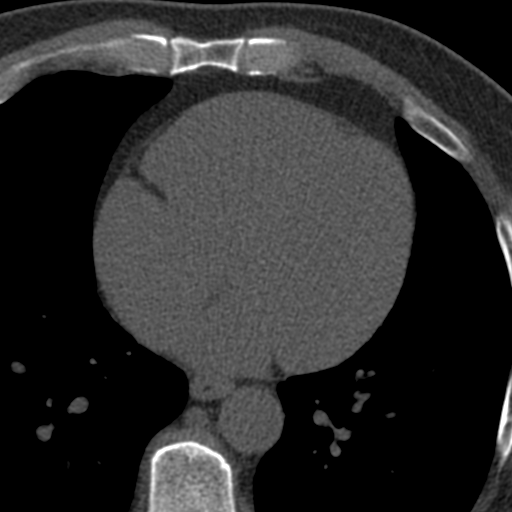
[im 30/45  vessel]
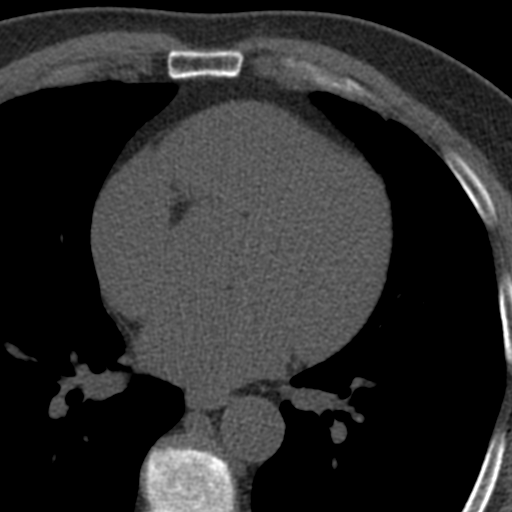
[im 35/45  vessel]
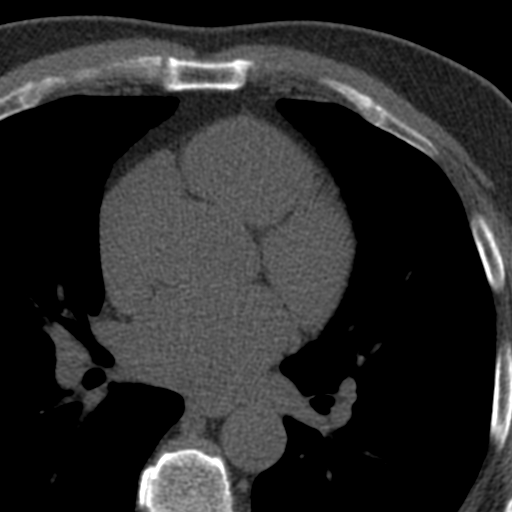

[Series 3: full fov st · axial · 0.66mm/px · z∈[-242,-137]mm · 8 of 45 slices shown, 10 images]
[im 5/45  vessel]
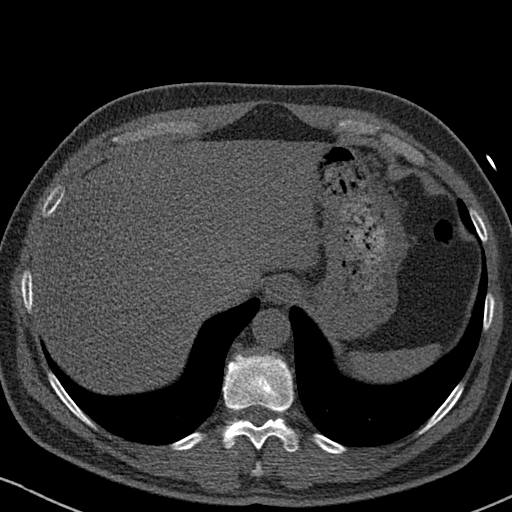
[im 5/45  lung]
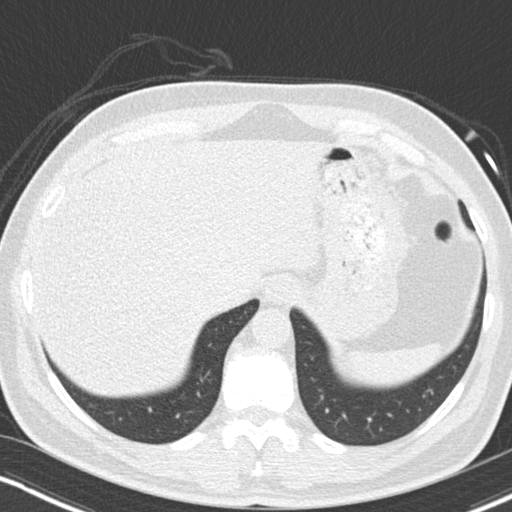
[im 10/45  vessel]
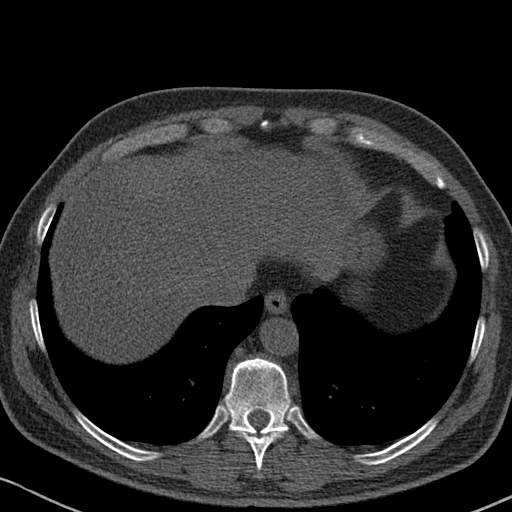
[im 15/45  vessel]
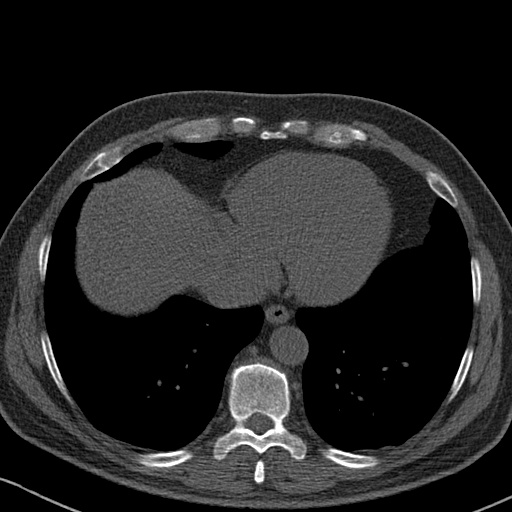
[im 20/45  vessel]
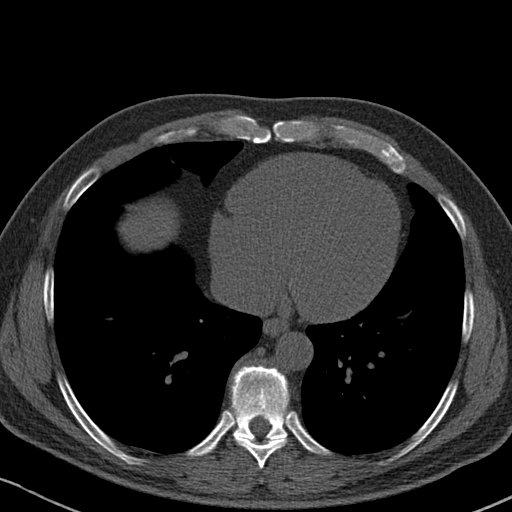
[im 25/45  vessel]
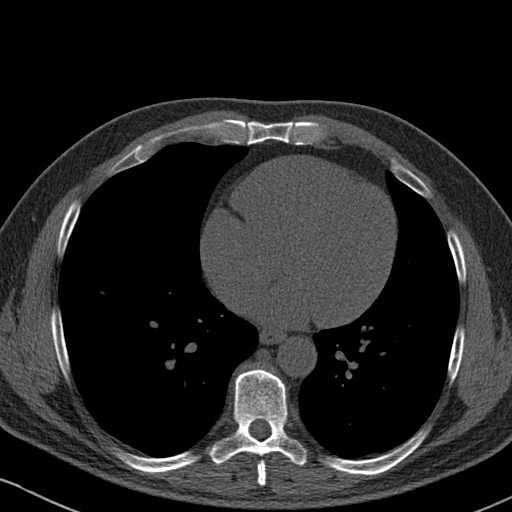
[im 25/45  lung]
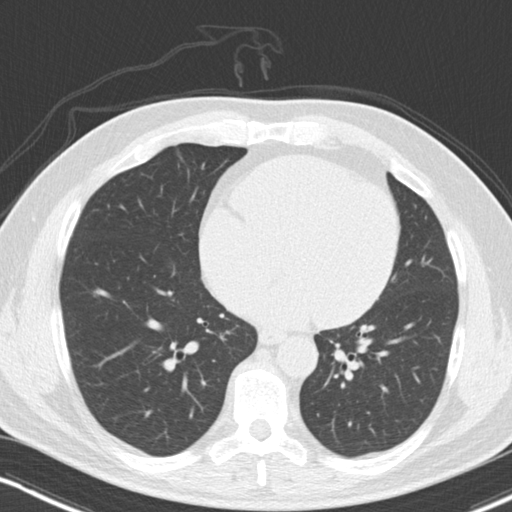
[im 30/45  vessel]
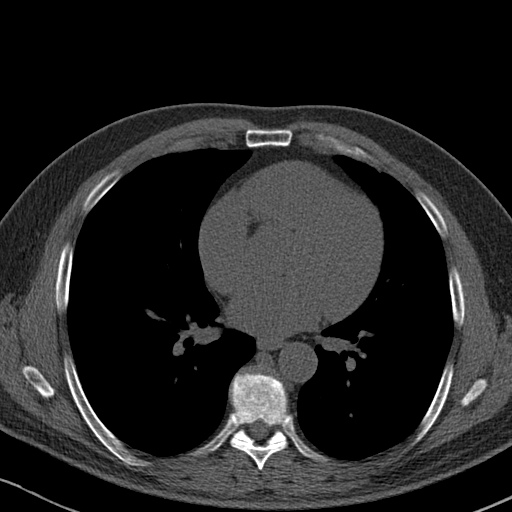
[im 35/45  vessel]
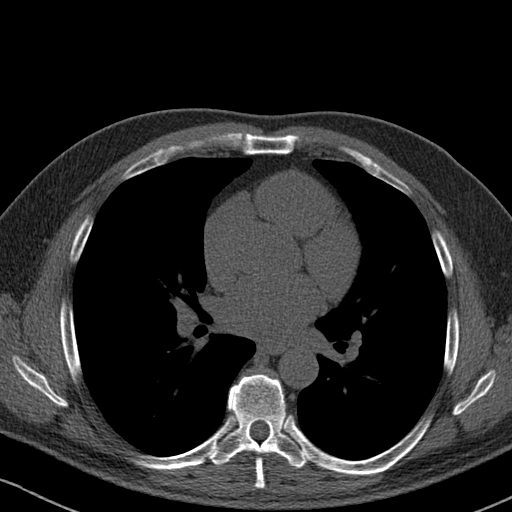
[im 40/45  vessel]
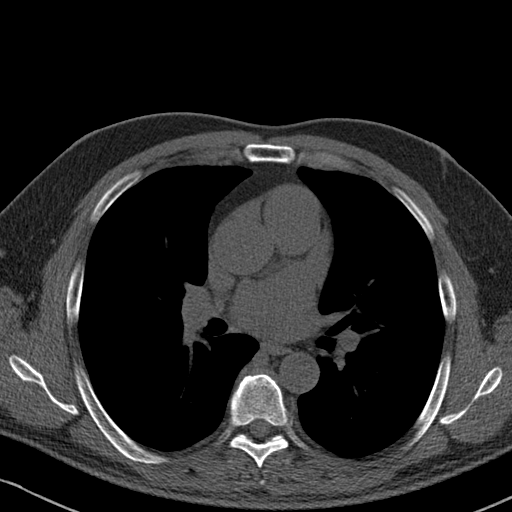

[15 of 20 positions shown; findings below may reference images not displayed]

FINDINGS: Vascular: No significant noncardiac vascular findings.

Mediastinum/Nodes: Visualized mediastinum and hilar regions
demonstrate no lymphadenopathy or masses.

Lungs/Pleura: Visualized lungs show no evidence of pulmonary edema,
consolidation, pneumothorax, nodule or pleural fluid.

Upper Abdomen: Visualized liver demonstrates evidence of steatosis.

Musculoskeletal: No chest wall mass or suspicious bone lesions
identified.
IMPRESSION: Evidence of hepatic steatosis.
FINDINGS: Coronary arteries: Normal origins.

Coronary Calcium Score:

Left main: 0

Left anterior descending artery:

Left circumflex artery: 0

Right coronary artery: 0

Total:

Percentile: 88th

Pericardium: Normal.

Ascending Aorta: Normal caliber.

Non-cardiac: See separate report from [REDACTED].
IMPRESSION: Coronary calcium score of 41.2. This was 88th percentile for age-,
race-, and sex-matched controls.



If CAC=0, it is reasonable to withhold statin therapy and reassess
in 5 to 10 years, as long as higher risk conditions are absent
(diabetes mellitus, family history of premature CHD in first degree
relatives (males <55 years; females <65 years), cigarette smoking,
or LDL >=190 mg/dL).

If CAC is 1 to 99, it is reasonable to initiate statin therapy for
patients >=55 years of age.

If CAC is >=100 or >=75th percentile, it is reasonable to initiate
statin therapy at any age.

Cardiology referral should be considered for patients with CAC
scores >=400 or >=75th percentile.

*5586 AHA/ACC/AACVPR/AAPA/ABC/GONG/RAHMETULLAH/NOCEAN/[place]/FLRS/STEPHENS/UYE
Guideline on the Management of Blood Cholesterol: A Report of the
American College of Cardiology/American Heart Association Task Force
on Clinical Practice Guidelines. J Am Coll Cardiol.
4817;73(24):9815-9057.

*** End of Addendum ***
EXAM:
OVER-READ INTERPRETATION  CT CHEST

The following report is an over-read performed by radiologist Dr.
over-read does not include interpretation of cardiac or coronary
anatomy or pathology. The coronary calcium score interpretation by
the cardiologist is attached.
FINDINGS: Vascular: No significant noncardiac vascular findings.

Mediastinum/Nodes: Visualized mediastinum and hilar regions
demonstrate no lymphadenopathy or masses.

Lungs/Pleura: Visualized lungs show no evidence of pulmonary edema,
consolidation, pneumothorax, nodule or pleural fluid.

Upper Abdomen: Visualized liver demonstrates evidence of steatosis.

Musculoskeletal: No chest wall mass or suspicious bone lesions
identified.
IMPRESSION: Evidence of hepatic steatosis.

## 2022-11-02 ENCOUNTER — Encounter: Payer: Self-pay | Admitting: Internal Medicine

## 2022-11-02 ENCOUNTER — Ambulatory Visit (INDEPENDENT_AMBULATORY_CARE_PROVIDER_SITE_OTHER): Payer: 59 | Admitting: Internal Medicine

## 2022-11-02 VITALS — BP 124/68 | HR 71 | Temp 97.9°F | Resp 16 | Ht 69.0 in | Wt 208.0 lb

## 2022-11-02 DIAGNOSIS — I10 Essential (primary) hypertension: Secondary | ICD-10-CM | POA: Diagnosis not present

## 2022-11-02 DIAGNOSIS — R739 Hyperglycemia, unspecified: Secondary | ICD-10-CM

## 2022-11-02 DIAGNOSIS — Z0000 Encounter for general adult medical examination without abnormal findings: Secondary | ICD-10-CM | POA: Diagnosis not present

## 2022-11-02 MED ORDER — LOSARTAN POTASSIUM 100 MG PO TABS: 100.0000 mg | ORAL_TABLET | Freq: Every day | ORAL | 3 refills | Status: DC

## 2022-11-02 MED ORDER — AMLODIPINE BESYLATE 5 MG PO TABS: 5.0000 mg | ORAL_TABLET | Freq: Every day | ORAL | 3 refills | Status: DC

## 2022-11-02 MED ORDER — ATORVASTATIN CALCIUM 10 MG PO TABS: 10.0000 mg | ORAL_TABLET | Freq: Every day | ORAL | 3 refills | Status: DC

## 2022-11-02 MED ORDER — BACLOFEN 20 MG PO TABS: 20.0000 mg | ORAL_TABLET | Freq: Three times a day (TID) | ORAL | 0 refills | Status: DC | PRN

## 2022-11-02 NOTE — Patient Instructions (Addendum)
Vaccines I recommend:  Covid booster  Check the  blood pressure regularly BP GOAL is between 110/65 and  135/85. If it is consistently higher or lower, let me know   Return the stool sample   GO TO THE LAB : Get the blood work     [place], [place] back for   a physical exam in 1 year

## 2022-11-02 NOTE — Assessment & Plan Note (Signed)
Here for CPX Hyperglycemia: Check A1c. HTN: On amlodipine and losartan, reports good ambulatory BPs.  Labs Hyperlipidemia: Had a coronary calcium score 11/2021, based on that he started atorvastatin with good results.  Recheck labs, continue atorvastatin Low back pain: Request a refill on baclofen which he uses mostly at night, sent.  Recommend to let me know if he has ongoing problems for a PT referral. RTC 1 year

## 2022-11-02 NOTE — Assessment & Plan Note (Signed)
-   Td 2017 - s/p shingrix x 2  - covid vax booster d/w pt  - had a flu shot  --CCS: 3 options discussed , previous iFOBs neg, repeat iFOB.  If he likes to proceed with a colonoscopy he will let me know -- prostate ca screening: No FH, no symptoms, check PSA --Has time to exercise once a week, encouraged healthy diet. --Labs: CMP FLP CBC A1c PSA iFOB

## 2022-11-02 NOTE — Progress Notes (Signed)
Subjective:    Patient ID: Elijah Kelly, male    DOB: [birth year], 52 y.o.   MRN: [id number]  DOS:  11/02/2022 Type of visit - description: cpx  Since the last office visit is doing okay in general. Does have occasional back pain, no radiation, no paresthesias.  Sees a chiropractor as needed.   Review of Systems  Other than above, a 14 point review of systems is negative    Past Medical History:  Diagnosis Date   Chest pain 02-2009    normal  stress ECHO w/ incidental patent foramen ovale   HTN (hypertension)    Palpitation    saw cards 2015    Past Surgical History:  Procedure Laterality Date   ADENOIDECTOMY     ELBOW SURGERY Left 2021   Social History   Socioeconomic History   Marital status: Married    Spouse name: Not on file   Number of children: 2   Years of education: Not on file   Highest education level: Not on file  Occupational History   Occupation: pharmaceuticals  Tobacco Use   Smoking status: Former   Smokeless tobacco: Never   Tobacco comments:    ~ 1989- 2009, < 1/2 ppd   Substance and Sexual Activity   Alcohol use: Yes    Comment: occasional   Drug use: No   Sexual activity: Not on file  Other Topics Concern   Not on file  Social History Narrative   Married, 2 children (2004, 2003), original from [place]   Daughter  graduated college   Son in  college         Social Determinants of Radio broadcast assistant Strain: Not on Art therapist Insecurity: Not on file  Transportation Needs: Not on file  Physical Activity: Not on file  Stress: Not on file  Social Connections: Not on file  Intimate Partner Violence: Not on file    Current Outpatient Medications  Medication Instructions   amLODipine (NORVASC) 5 mg, Oral, Daily   aspirin 81 mg, Daily   atorvastatin (LIPITOR) 10 mg, Oral, Daily at bedtime   baclofen (LIORESAL) 20 mg, Oral, 3 times daily PRN   losartan (COZAAR) 100 mg, Oral, Daily       Objective:   Physical Exam BP  124/68   Pulse 71   Temp 97.9 F (36.6 C) (Oral)   Resp 16   Ht '5\' 9"'$  (1.753 m)   Wt 208 lb (94.3 kg)   SpO2 97%   BMI 30.72 kg/m  General: Well developed, NAD, BMI noted Neck: No  thyromegaly  HEENT:  Normocephalic . Face symmetric, atraumatic Lungs:  CTA B Normal respiratory effort, no intercostal retractions, no accessory muscle use. Heart: RRR,  no murmur.  Abdomen:  Not distended, soft, non-tender. No rebound or rigidity.   Lower extremities: no pretibial edema bilaterally  Skin: Exposed areas without rash. Not pale. Not jaundice Neurologic:  alert & oriented X3.  Speech normal, gait appropriate for age and unassisted Strength symmetric and appropriate for age.  Psych: Cognition and judgment appear intact.  Cooperative with normal attention span and concentration.  Behavior appropriate. No anxious or depressed appearing.     Assessment     Assessment Hyperglycemia: A1c 6.2 (12-2018) HTN CV: ---Chest pain, 2010, (-) stress echo w/ incidental patent foramen ovale ---Palpitations: saw Cardiology 2015.  Cardiac event monitor 2015 essentially negative ---Coronary Ca+ Score 11-2021: 41.2, 88th percentile, rx statins  Rash, sun related--on hydrocortisone  Prn,  sun blockers   PLAN: Here for CPX Hyperglycemia: Check A1c. HTN: On amlodipine and losartan, reports good ambulatory BPs.  Labs Hyperlipidemia: Had a coronary calcium score 11/2021, based on that he started atorvastatin with good results.  Recheck labs, continue atorvastatin Low back pain: Request a refill on baclofen which he uses mostly at night, sent.  Recommend to let me know if he has ongoing problems for a PT referral. RTC 1 year

## 2022-11-03 LAB — COMPREHENSIVE METABOLIC PANEL
ALT: 24 U/L (ref 0–53)
AST: 12 U/L (ref 0–37)
Albumin: 4.4 g/dL (ref 3.5–5.2)
Alkaline Phosphatase: 77 U/L (ref 39–117)
BUN: 15 mg/dL (ref 6–23)
CO2: 28 mEq/L (ref 19–32)
Calcium: 9.6 mg/dL (ref 8.4–10.5)
Chloride: 103 mEq/L (ref 96–112)
Creatinine, Ser: 0.88 mg/dL (ref 0.40–1.50)
GFR: 98.55 mL/min (ref >60.00)
Glucose, Bld: 88 mg/dL (ref 70–99)
Potassium: 4.8 mEq/L (ref 3.5–5.1)
Sodium: 139 mEq/L (ref 135–145)
Total Bilirubin: 1.2 mg/dL (ref 0.2–1.2)
Total Protein: 7.5 g/dL (ref 6.0–8.3)

## 2022-11-03 LAB — CBC WITH DIFFERENTIAL/PLATELET
Basophils Absolute: 0 10*3/uL (ref 0.0–0.1)
Basophils Relative: 0.7 % (ref 0.0–3.0)
Eosinophils Absolute: 0.1 10*3/uL (ref 0.0–0.7)
Eosinophils Relative: 2.4 % (ref 0.0–5.0)
HCT: 43.6 % (ref 39.0–52.0)
Hemoglobin: 14.5 g/dL (ref 13.0–17.0)
Lymphocytes Relative: 42.3 % (ref 12.0–46.0)
Lymphs Abs: 2.4 10*3/uL (ref 0.7–4.0)
MCHC: 33.4 g/dL (ref 30.0–36.0)
MCV: 88.9 fl (ref 78.0–100.0)
Monocytes Absolute: 0.4 10*3/uL (ref 0.1–1.0)
Monocytes Relative: 7.2 % (ref 3.0–12.0)
Neutro Abs: 2.7 10*3/uL (ref 1.4–7.7)
Neutrophils Relative %: 47.4 % (ref 43.0–77.0)
Platelets: 258 10*3/uL (ref 150.0–400.0)
RBC: 4.9 Mil/uL (ref 4.22–5.81)
RDW: 13.5 % (ref 11.5–15.5)
WBC: 5.6 10*3/uL (ref 4.0–10.5)

## 2022-11-03 LAB — LIPID PANEL
Cholesterol: 116 mg/dL (ref 0–200)
HDL: 40.3 mg/dL (ref >39.00)
LDL Cholesterol: 61 mg/dL (ref 0–99)
NonHDL: 75.99
Total CHOL/HDL Ratio: 3
Triglycerides: 77 mg/dL (ref 0.0–149.0)
VLDL: 15.4 mg/dL (ref 0.0–40.0)

## 2022-11-03 LAB — PSA: PSA: 0.56 ng/mL (ref 0.10–4.00)

## 2022-11-03 LAB — HEMOGLOBIN A1C: Hgb A1c MFr Bld: 6 % (ref 4.6–6.5)

## 2022-11-13 ENCOUNTER — Other Ambulatory Visit: Payer: 59

## 2022-11-16 ENCOUNTER — Other Ambulatory Visit: Payer: 59

## 2022-11-16 DIAGNOSIS — Z0000 Encounter for general adult medical examination without abnormal findings: Secondary | ICD-10-CM

## 2022-11-17 NOTE — Addendum Note (Signed)
Addended by: Kelle [place] A on: 11/17/2022 07:08 AM   Modules accepted: Orders

## 2022-11-19 LAB — FECAL OCCULT BLOOD, IMMUNOCHEMICAL: Fecal Occult Bld: NEGATIVE

## 2022-12-21 ENCOUNTER — Encounter: Payer: Self-pay | Admitting: *Deleted

## 2023-01-05 ENCOUNTER — Other Ambulatory Visit: Payer: Self-pay | Admitting: Internal Medicine

## 2023-01-05 MED ORDER — BACLOFEN 20 MG PO TABS: 20.0000 mg | ORAL_TABLET | Freq: Three times a day (TID) | ORAL | 0 refills | Status: DC | PRN

## 2023-03-17 ENCOUNTER — Other Ambulatory Visit: Payer: Self-pay | Admitting: Internal Medicine

## 2023-03-18 MED ORDER — BACLOFEN 20 MG PO TABS: 20.0000 mg | ORAL_TABLET | Freq: Three times a day (TID) | ORAL | 2 refills | Status: DC | PRN

## 2023-08-11 ENCOUNTER — Other Ambulatory Visit: Payer: Self-pay

## 2023-08-11 MED ORDER — BACLOFEN 20 MG PO TABS: 20.0000 mg | ORAL_TABLET | Freq: Three times a day (TID) | ORAL | 0 refills | Status: DC | PRN

## 2023-11-05 ENCOUNTER — Ambulatory Visit: Payer: 59 | Admitting: Internal Medicine

## 2023-11-05 ENCOUNTER — Encounter: Payer: Self-pay | Admitting: Internal Medicine

## 2023-11-05 VITALS — BP 138/100 | HR 60 | Temp 97.6°F | Ht 69.0 in | Wt 213.4 lb

## 2023-11-05 DIAGNOSIS — Z125 Encounter for screening for malignant neoplasm of prostate: Secondary | ICD-10-CM | POA: Diagnosis not present

## 2023-11-05 DIAGNOSIS — I10 Essential (primary) hypertension: Secondary | ICD-10-CM

## 2023-11-05 DIAGNOSIS — Z23 Encounter for immunization: Secondary | ICD-10-CM | POA: Diagnosis not present

## 2023-11-05 DIAGNOSIS — Z0001 Encounter for general adult medical examination with abnormal findings: Secondary | ICD-10-CM | POA: Diagnosis not present

## 2023-11-05 DIAGNOSIS — E785 Hyperlipidemia, unspecified: Secondary | ICD-10-CM

## 2023-11-05 DIAGNOSIS — E66811 Obesity, class 1: Secondary | ICD-10-CM | POA: Diagnosis not present

## 2023-11-05 DIAGNOSIS — R739 Hyperglycemia, unspecified: Secondary | ICD-10-CM

## 2023-11-05 DIAGNOSIS — Z6831 Body mass index (BMI) 31.0-31.9, adult: Secondary | ICD-10-CM

## 2023-11-05 DIAGNOSIS — Z0000 Encounter for general adult medical examination without abnormal findings: Secondary | ICD-10-CM

## 2023-11-05 LAB — COMPREHENSIVE METABOLIC PANEL
ALT: 28 U/L (ref 0–53)
AST: 14 U/L (ref 0–37)
Albumin: 4.5 g/dL (ref 3.5–5.2)
Alkaline Phosphatase: 71 U/L (ref 39–117)
BUN: 15 mg/dL (ref 6–23)
CO2: 27 meq/L (ref 19–32)
Calcium: 8.9 mg/dL (ref 8.4–10.5)
Chloride: 104 meq/L (ref 96–112)
Creatinine, Ser: 0.95 mg/dL (ref 0.40–1.50)
GFR: 91.08 mL/min (ref >60.00)
Glucose, Bld: 87 mg/dL (ref 70–99)
Potassium: 4.2 meq/L (ref 3.5–5.1)
Sodium: 140 meq/L (ref 135–145)
Total Bilirubin: 1 mg/dL (ref 0.2–1.2)
Total Protein: 7.6 g/dL (ref 6.0–8.3)

## 2023-11-05 LAB — LIPID PANEL
Cholesterol: 114 mg/dL (ref 0–200)
HDL: 32.8 mg/dL — ABNORMAL LOW (ref >39.00)
LDL Cholesterol: 63 mg/dL (ref 0–99)
NonHDL: 81.53
Total CHOL/HDL Ratio: 3
Triglycerides: 91 mg/dL (ref 0.0–149.0)
VLDL: 18.2 mg/dL (ref 0.0–40.0)

## 2023-11-05 LAB — CBC WITH DIFFERENTIAL/PLATELET
Basophils Absolute: 0 10*3/uL (ref 0.0–0.1)
Basophils Relative: 0.8 % (ref 0.0–3.0)
Eosinophils Absolute: 0.1 10*3/uL (ref 0.0–0.7)
Eosinophils Relative: 3.7 % (ref 0.0–5.0)
HCT: 44.9 % (ref 39.0–52.0)
Hemoglobin: 15 g/dL (ref 13.0–17.0)
Lymphocytes Relative: 57.1 % — ABNORMAL HIGH (ref 12.0–46.0)
Lymphs Abs: 2.2 10*3/uL (ref 0.7–4.0)
MCHC: 33.4 g/dL (ref 30.0–36.0)
MCV: 89 fl (ref 78.0–100.0)
Monocytes Absolute: 0.4 10*3/uL (ref 0.1–1.0)
Monocytes Relative: 9.2 % (ref 3.0–12.0)
Neutro Abs: 1.1 10*3/uL — ABNORMAL LOW (ref 1.4–7.7)
Neutrophils Relative %: 29.2 % — ABNORMAL LOW (ref 43.0–77.0)
Platelets: 198 10*3/uL (ref 150.0–400.0)
RBC: 5.04 Mil/uL (ref 4.22–5.81)
RDW: 13.7 % (ref 11.5–15.5)
WBC: 3.9 10*3/uL — ABNORMAL LOW (ref 4.0–10.5)

## 2023-11-05 LAB — HEMOGLOBIN A1C: Hgb A1c MFr Bld: 6.3 % (ref 4.6–6.5)

## 2023-11-05 LAB — TSH: TSH: 1.93 u[IU]/mL (ref 0.35–5.50)

## 2023-11-05 LAB — PSA: PSA: 0.47 ng/mL (ref 0.10–4.00)

## 2023-11-05 NOTE — Assessment & Plan Note (Signed)
 Here for CPX   Other issues addressed today: Hyperglycemia: Check A1c HTN: Diastolic BP slightly elevated upon arrival, ambulatory BPs are WNL.  Good compliance with amlodipine losartan. Recheck BP: Still slightly elevated. Plan: Diet, exercise, checking labs, reassess in 3 months Hyperlipidemia: On atorvastatin 10 mg, checking labs. Obesity: We had a very long discussion about diet.  Recommend calorie counting for 2 weeks, then if calorie intake is high, decrease it. We could talk about pharmacological management if he feels necessary when he comes back. RTC 3 months

## 2023-11-05 NOTE — Assessment & Plan Note (Addendum)
 Here for CPX - Td 2017 - s/p shingrix x 2 - Flu shot today and every fall - Vax I rec: vcovid vax  --CCS: ifob neg 11/2022 3 options discussed, elected to continue doing ifobs -- prostate ca screening: No FH, no symptoms, check PSA -- Diet: See comments under obesity  --Labs: CMP FLP CBC A1c PSA TSH

## 2023-11-05 NOTE — Patient Instructions (Addendum)
 To lose weight. - Start tracking your calories, you can use [employer] app -Then eat less calories and increase your physical activity. - Consider structure diet such as weight watchers  Return the stool test  Your blood pressure is slightly high today, check your blood pressure regularly Blood pressure goal:  between 110/65 and  135/85. If it is consistently higher or lower, let me know     GO TO THE LAB : Get the blood work     Please go to the front desk and schedule the following: Follow-up in 3 months  (to recheck your blood pressure and talk about your weight)       "Health Care Power of attorney" ,  "Living will" (Advance care planning documents)  If you already have a living will or healthcare power of attorney, is recommended you bring the copy to be scanned in your chart.   The document will be available to all the doctors you see in the system.  Advance care planning is a process that supports adults in  understanding and sharing their preferences regarding future medical care.  The patient's preferences are recorded in documents called Advance Directives and the can be modified at any time while the patient is in full mental capacity.   If you don't have one, please consider create one.      More information at: [email or web address]

## 2023-11-05 NOTE — Progress Notes (Signed)
 Subjective:    Patient ID: Elijah Kelly, male    DOB: [birth year], 53 y.o.   MRN: [id number]  DOS:  11/05/2023 Type of visit - description: CPX  Here for CPX. Concerned about his weight, has been unable to lose any.  Wt Readings from Last 3 Encounters:  11/05/23 213 lb 6.4 oz (96.8 kg)  11/02/22 208 lb (94.3 kg)  10/28/21 203 lb 2 oz (92.1 kg)     Review of Systems  Other than above, a 14 point review of systems is negative      Past Medical History:  Diagnosis Date   Chest pain 02-2009    normal  stress ECHO w/ incidental patent foramen ovale   HTN (hypertension)    Palpitation    saw cards 2015    Past Surgical History:  Procedure Laterality Date   ADENOIDECTOMY     ELBOW SURGERY Left 2021   Social History   Socioeconomic History   Marital status: Married    Spouse name: Not on file   Number of children: 2   Years of education: Not on file   Highest education level: Not on file  Occupational History   Occupation: pharmaceuticals  Tobacco Use   Smoking status: Former   Smokeless tobacco: Never   Tobacco comments:    ~ 1989- 2009, < 1/2 ppd   Substance and Sexual Activity   Alcohol use: Yes    Comment: occasional   Drug use: No   Sexual activity: Not on file  Other Topics Concern   Not on file  Social History Narrative   Married, 2 children (2004, 2003), original from [place]   Daughter  graduated college, going to Social worker school    Son in  college         Social Drivers of Corporate investment banker Strain: Not on file  Food Insecurity: Not on file  Transportation Needs: Not on file  Physical Activity: Not on file  Stress: Not on file  Social Connections: Not on file  Intimate Partner Violence: Not on file     Current Outpatient Medications  Medication Instructions   amLODipine (NORVASC) 5 mg, Oral, Daily   aspirin 81 mg, Daily   atorvastatin (LIPITOR) 10 mg, Oral, Daily at bedtime   baclofen (LIORESAL) 20 mg, Oral, 3 times daily PRN    losartan (COZAAR) 100 mg, Oral, Daily       Objective:   Physical Exam BP (!) 138/100 (BP Location: Left Arm, Patient Position: Sitting, Cuff Size: Normal)   Pulse 60   Temp 97.6 F (36.4 C)   Ht 5\' 9"  (1.753 m)   Wt 213 lb 6.4 oz (96.8 kg)   SpO2 97%   BMI 31.51 kg/m  General: Well developed, NAD, BMI noted Neck: No  thyromegaly  HEENT:  Normocephalic . Face symmetric, atraumatic Lungs:  CTA B Normal respiratory effort, no intercostal retractions, no accessory muscle use. Heart: RRR,  no murmur.  Abdomen:  Not distended, soft, non-tender. No rebound or rigidity.   Lower extremities: no pretibial edema bilaterally  Skin: Exposed areas without rash. Not pale. Not jaundice Neurologic:  alert & oriented X3.  Speech normal, gait appropriate for age and unassisted Strength symmetric and appropriate for age.  Psych: Cognition and judgment appear intact.  Cooperative with normal attention span and concentration.  Behavior appropriate. No anxious or depressed appearing.     Assessment     Assessment Hyperglycemia: A1c 6.2 (12-2018) HTN CV: ---Chest pain, 2010, (-)  stress echo w/ incidental patent foramen ovale ---Palpitations: saw Cardiology 2015.  Cardiac event monitor 2015 essentially negative ---Coronary Ca+ Score 11-2021: 41.2, 88th percentile, rx statins  Rash, sun related--on hydrocortisone  Prn, sun blockers   PLAN: Here for CPX - Td 2017 - s/p shingrix x 2 - Flu shot today and every fall - Vax I rec: vcovid vax  --CCS: ifob neg 11/2022 3 options discussed, elected to continue doing ifobs -- prostate ca screening: No FH, no symptoms, check PSA -- Diet: See comments under obesity  --Labs: CMP FLP CBC A1c PSA TSH Other issues addressed today: Hyperglycemia: Check A1c HTN: Diastolic BP slightly elevated upon arrival, ambulatory BPs are WNL.  Good compliance with amlodipine losartan. Recheck BP: Still slightly elevated. Plan: Diet, exercise, checking labs,  reassess in 3 months Hyperlipidemia: On atorvastatin 10 mg, checking labs. Obesity: We had a very long discussion about diet.  Recommend calorie counting for 2 weeks, then if calorie intake is high, decrease it. We could talk about pharmacological management if he feels necessary when he comes back. RTC 3 months

## 2023-11-07 ENCOUNTER — Encounter: Payer: Self-pay | Admitting: Internal Medicine

## 2023-11-09 ENCOUNTER — Other Ambulatory Visit (INDEPENDENT_AMBULATORY_CARE_PROVIDER_SITE_OTHER)

## 2023-11-09 DIAGNOSIS — Z0000 Encounter for general adult medical examination without abnormal findings: Secondary | ICD-10-CM

## 2023-11-10 ENCOUNTER — Encounter: Payer: 59 | Admitting: Internal Medicine

## 2023-11-10 LAB — FECAL OCCULT BLOOD, IMMUNOCHEMICAL: Fecal Occult Bld: NEGATIVE

## 2023-11-11 ENCOUNTER — Encounter: Payer: Self-pay | Admitting: Internal Medicine

## 2023-11-22 ENCOUNTER — Other Ambulatory Visit: Payer: Self-pay | Admitting: Internal Medicine

## 2023-12-06 ENCOUNTER — Other Ambulatory Visit: Payer: Self-pay | Admitting: Internal Medicine

## 2024-02-04 ENCOUNTER — Ambulatory Visit (INDEPENDENT_AMBULATORY_CARE_PROVIDER_SITE_OTHER): Payer: 59 | Admitting: Internal Medicine

## 2024-02-04 ENCOUNTER — Encounter: Payer: Self-pay | Admitting: Internal Medicine

## 2024-02-04 VITALS — BP 122/68 | HR 68 | Temp 97.7°F | Resp 16 | Ht 69.0 in | Wt 197.4 lb

## 2024-02-04 DIAGNOSIS — I10 Essential (primary) hypertension: Secondary | ICD-10-CM | POA: Diagnosis not present

## 2024-02-04 DIAGNOSIS — Z6831 Body mass index (BMI) 31.0-31.9, adult: Secondary | ICD-10-CM | POA: Diagnosis not present

## 2024-02-04 DIAGNOSIS — N529 Male erectile dysfunction, unspecified: Secondary | ICD-10-CM | POA: Diagnosis not present

## 2024-02-04 DIAGNOSIS — E66811 Obesity, class 1: Secondary | ICD-10-CM

## 2024-02-04 MED ORDER — AMLODIPINE BESYLATE 5 MG PO TABS: 5.0000 mg | ORAL_TABLET | Freq: Every day | ORAL | 1 refills | Status: DC

## 2024-02-04 MED ORDER — ATORVASTATIN CALCIUM 10 MG PO TABS: 10.0000 mg | ORAL_TABLET | Freq: Every day | ORAL | 1 refills | Status: DC

## 2024-02-04 MED ORDER — SILDENAFIL CITRATE 20 MG PO TABS: 60.0000 mg | ORAL_TABLET | Freq: Every evening | ORAL | 3 refills | Status: AC | PRN

## 2024-02-04 MED ORDER — LOSARTAN POTASSIUM 100 MG PO TABS: 100.0000 mg | ORAL_TABLET | Freq: Every day | ORAL | 1 refills | Status: DC

## 2024-02-04 NOTE — Patient Instructions (Addendum)
 Okay to start sildenafil. Take it 30 to 40 minutes before sexual activity. Take 3 or 4 tablets no more than daily. Watch for side effects   Check the  blood pressure regularly Blood pressure goal:  between 110/65 and  135/85. If it is consistently higher or lower, let me know    Next office visit for a check up in 4-5 months  Please make an appointment before you leave today

## 2024-02-04 NOTE — Progress Notes (Signed)
   Subjective:    Patient ID: Elijah Kelly, male    DOB: 1970/12/19, 53 y.o.   MRN: [id number]  DOS:  02/04/2024 Type of visit - description: Follow-up  Chronic medical problems addressed. Obesity: Doing great, + weight loss. New issue: Some decrease of erections over the last few years.  Treatment?  Wt Readings from Last 3 Encounters:  02/04/24 197 lb 6 oz (89.5 kg)  11/05/23 213 lb 6.4 oz (96.8 kg)  11/02/22 208 lb (94.3 kg)     Review of Systems See above   Past Medical History:  Diagnosis Date   Chest pain 02-2009    normal  stress ECHO w/ incidental patent foramen ovale   HTN (hypertension)    Palpitation    saw cards 2015    Past Surgical History:  Procedure Laterality Date   ADENOIDECTOMY     ELBOW SURGERY Left 2021    Current Outpatient Medications  Medication Instructions   amLODipine  (NORVASC ) 5 mg, Oral, Daily   aspirin 81 mg, Daily   atorvastatin  (LIPITOR) 10 mg, Oral, Daily at bedtime   baclofen  (LIORESAL ) 20 mg, Oral, 3 times daily PRN   losartan  (COZAAR ) 100 mg, Oral, Daily       Objective:   Physical Exam BP 122/68   Pulse 68   Temp 97.7 F (36.5 C) (Oral)   Resp 16   Ht 5\' 9"  (1.753 m)   Wt 197 lb 6 oz (89.5 kg)   SpO2 97%   BMI 29.15 kg/m  General:   Well developed, NAD, BMI noted. HEENT:  Normocephalic . Face symmetric, atraumatic Lungs:  CTA B Normal respiratory effort, no intercostal retractions, no accessory muscle use. Heart: RRR,  no murmur.  Lower extremities: no pretibial edema bilaterally  Skin: Not pale. Not jaundice Neurologic:  alert & oriented X3.  Speech normal, gait appropriate for age and unassisted Psych--  Cognition and judgment appear intact.  Cooperative with normal attention span and concentration.  Behavior appropriate. No anxious or depressed appearing.      Assessment   Assessment Hyperglycemia: A1c 6.2 (12-2018) HTN CV: ---Chest pain, 2010, (-) stress echo w/ incidental patent foramen  ovale ---Palpitations: saw Cardiology 2015.  Cardiac event monitor 2015 essentially negative ---Coronary Ca+ Score 11-2021: 41.2, 88th percentile, rx statins  Rash, sun related--on hydrocortisone   Prn, sun blockers   PLAN: Obesity: Since LOV, has lost, doing great, has lost 16 pounds by cutting out carbohydrates and increased exercise.  Recommend to continue the same path, reassess in 4 to 5 months. HTN: Ambulatory BPs remain controlled.  RF meds ED: New issue, treatment?  I do not see a contra indication for sildenafil.  How to use it, what to expect d/w pt; a rx was sent. RTC 4 to 5 months

## 2024-02-04 NOTE — Assessment & Plan Note (Signed)
 Obesity: Since LOV, has lost, doing great, has lost 16 pounds by cutting out carbohydrates and increased exercise.  Recommend to continue the same path, reassess in 4 to 5 months. HTN: Ambulatory BPs remain controlled.  RF meds ED: New issue, treatment?  I do not see a contra indication for sildenafil.  How to use it, what to expect d/w pt; a rx was sent. RTC 4 to 5 months

## 2024-02-11 ENCOUNTER — Other Ambulatory Visit (HOSPITAL_COMMUNITY): Payer: Self-pay

## 2024-02-11 ENCOUNTER — Telehealth: Payer: Self-pay

## 2024-02-11 NOTE — Telephone Encounter (Signed)
 Pharmacy Patient Advocate Encounter   Received notification from CoverMyMeds that prior authorization for Sildenafil  Citrate (PAH) 20MG  tablets is required/requested.   Insurance verification completed.   The patient is insured through [employer] .   Per test claim: PA required; PA submitted to above mentioned insurance via CoverMyMeds Key/confirmation #/EOC [id number] Status is pending

## 2024-02-23 NOTE — Telephone Encounter (Signed)
 Pharmacy Patient Advocate Encounter  Received notification from [employer] that Prior Authorization for SILDENAFIL  20MG  TABS has been DENIED.  Full denial letter will be uploaded to the media tab. See denial reason below.   PA #/Case ID/Reference #: [id number]

## 2024-05-29 ENCOUNTER — Other Ambulatory Visit: Payer: Self-pay | Admitting: Internal Medicine

## 2024-07-11 ENCOUNTER — Ambulatory Visit: Admitting: Internal Medicine

## 2024-08-07 ENCOUNTER — Other Ambulatory Visit: Payer: Self-pay | Admitting: Internal Medicine

## 2024-08-09 ENCOUNTER — Other Ambulatory Visit: Payer: Self-pay | Admitting: Internal Medicine

## 2024-11-06 ENCOUNTER — Encounter: Admitting: Internal Medicine
